# Patient Record
Sex: Female | Born: 1950 | Race: White | Hispanic: No | Marital: Married | Smoking: Former smoker
Health system: Southern US, Community
[De-identification: ages and names within clinical notes are randomized; demographics above are authoritative.]

## PROBLEM LIST (undated history)

## (undated) DIAGNOSIS — M1990 Unspecified osteoarthritis, unspecified site: Secondary | ICD-10-CM

## (undated) DIAGNOSIS — M8580 Other specified disorders of bone density and structure, unspecified site: Secondary | ICD-10-CM

## (undated) DIAGNOSIS — M810 Age-related osteoporosis without current pathological fracture: Secondary | ICD-10-CM

## (undated) DIAGNOSIS — F419 Anxiety disorder, unspecified: Secondary | ICD-10-CM

## (undated) DIAGNOSIS — H269 Unspecified cataract: Secondary | ICD-10-CM

## (undated) HISTORY — PX: WRIST SURGERY: SHX841

## (undated) HISTORY — DX: Unspecified cataract: H26.9

## (undated) HISTORY — DX: Anxiety disorder, unspecified: F41.9

## (undated) HISTORY — DX: Age-related osteoporosis without current pathological fracture: M81.0

## (undated) HISTORY — PX: OTHER SURGICAL HISTORY: SHX169

## (undated) HISTORY — DX: Unspecified osteoarthritis, unspecified site: M19.90

## (undated) HISTORY — PX: EYE SURGERY: SHX253

---

## 2017-02-06 DIAGNOSIS — F419 Anxiety disorder, unspecified: Secondary | ICD-10-CM | POA: Insufficient documentation

## 2017-09-27 HISTORY — PX: ESOPHAGOGASTRODUODENOSCOPY: SHX1529

## 2018-09-01 ENCOUNTER — Other Ambulatory Visit: Payer: Self-pay

## 2018-09-01 ENCOUNTER — Emergency Department: Payer: Medicare Other

## 2018-09-01 ENCOUNTER — Emergency Department
Admission: EM | Admit: 2018-09-01 | Discharge: 2018-09-01 | Disposition: A | Discharge status: Home / Self Care | Payer: Medicare Other | Attending: Emergency Medicine | Admitting: Emergency Medicine

## 2018-09-01 ENCOUNTER — Encounter: Payer: Self-pay | Admitting: Emergency Medicine

## 2018-09-01 DIAGNOSIS — Y9389 Activity, other specified: Secondary | ICD-10-CM | POA: Diagnosis not present

## 2018-09-01 DIAGNOSIS — Z87891 Personal history of nicotine dependence: Secondary | ICD-10-CM | POA: Insufficient documentation

## 2018-09-01 DIAGNOSIS — Y998 Other external cause status: Secondary | ICD-10-CM | POA: Diagnosis not present

## 2018-09-01 DIAGNOSIS — W010XXA Fall on same level from slipping, tripping and stumbling without subsequent striking against object, initial encounter: Secondary | ICD-10-CM | POA: Insufficient documentation

## 2018-09-01 DIAGNOSIS — Y92009 Unspecified place in unspecified non-institutional (private) residence as the place of occurrence of the external cause: Secondary | ICD-10-CM | POA: Insufficient documentation

## 2018-09-01 DIAGNOSIS — S93601A Unspecified sprain of right foot, initial encounter: Secondary | ICD-10-CM | POA: Insufficient documentation

## 2018-09-01 DIAGNOSIS — S99921A Unspecified injury of right foot, initial encounter: Secondary | ICD-10-CM | POA: Diagnosis present

## 2018-09-01 HISTORY — DX: Other specified disorders of bone density and structure, unspecified site: M85.80

## 2018-09-01 NOTE — ED Provider Notes (Signed)
[facility] Emergency Department Provider Note  ____________________________________________  Time seen: Approximately 12:54 PM  I have reviewed the triage vital signs and the nursing notes.   HISTORY  Chief Complaint Ankle Pain    HPI Paige Suarez is a 67 y.o. female presents emergency department for evaluation of right foot pain after fall this morning.  Patient states that she has from [place] visiting her family and tripped over there large yellow dog this morning.  She twisted her right foot.  Pain is over the outside of her foot.  She has had difficulty putting weight on foot since incident.  She denies hitting her head or losing consciousness.  No additional injuries.   Past Medical History:  Diagnosis Date  . Osteopenia     There are no active problems to display for this patient.   History reviewed. No pertinent surgical history.  Prior to Admission medications   Not on File    Allergies Oxycodone  No family history on file.  Social History Social History   Tobacco Use  . Smoking status: Former Smoker    Types: Cigarettes    Last attempt to quit: 1999    Years since quitting: 20.8  . Smokeless tobacco: Never Used  Substance Use Topics  . Alcohol use: Not on file  . Drug use: Not on file     Review of Systems  Gastrointestinal:  No nausea, no vomiting.  Musculoskeletal: Positive for foot pain.  Skin: Negative for  abrasions, lacerations.  Positive for ecchymosis. Neurological: Negative for numbness or tingling   ____________________________________________   PHYSICAL EXAM:  VITAL SIGNS: ED Triage Vitals  Enc Vitals Group     BP 09/01/18 1252 (!) 141/100     Pulse Rate 09/01/18 1252 65     Resp 09/01/18 1252 20     Temp 09/01/18 1252 98.3 F (36.8 C)     Temp Source 09/01/18 1252 Oral     SpO2 09/01/18 1252 98 %     Weight 09/01/18 1056 100 lb (45.4 kg)     Height 09/01/18 1056 5\' 1"  (1.549 m)     Head  Circumference --      Peak Flow --      Pain Score 09/01/18 1055 8     Pain Loc --      Pain Edu? --      Excl. in GC? --      Constitutional: Alert and oriented. Well appearing and in no acute distress. Eyes: Conjunctivae are normal. PERRL. EOMI. Head: Atraumatic. ENT:      Ears:      Nose: No congestion/rhinnorhea.      Mouth/Throat: Mucous membranes are moist.  Neck: No stridor. Cardiovascular: Normal rate, regular rhythm.  Good peripheral circulation. Respiratory: Normal respiratory effort without tachypnea or retractions. Lungs CTAB. Good air entry to the bases with no decreased or absent breath sounds. Musculoskeletal: Full range of motion to all extremities. No gross deformities appreciated.  2 cm area of ecchymosis to lateral foot. No swelling.  No tenderness to palpation over lateral or medial malleolus.  Symmetric dorsalis pedis pulses bilaterally. Neurologic:  Normal speech and language. No gross focal neurologic deficits are appreciated.  Skin:  Skin is warm, dry and intact. No rash noted. Psychiatric: Mood and affect are normal. Speech and behavior are normal. Patient exhibits appropriate insight and judgement.   ____________________________________________   LABS (all labs ordered are listed, but only abnormal results are displayed)  Labs Reviewed -  No data to display ____________________________________________  EKG   ____________________________________________  RADIOLOGY Lexine Baton, personally viewed and evaluated these images (plain radiographs) as part of my medical decision making, as well as reviewing the written report by the radiologist.  Dg Ankle Complete Right  Result Date: 09/01/2018 CLINICAL DATA:  Ankle pain.  Twisting injury. EXAM: RIGHT ANKLE - COMPLETE 3+ VIEW COMPARISON:  Right foot 09/01/2018 FINDINGS: Right ankle is located without a fracture. No soft tissue swelling. Normal alignment. IMPRESSION: Negative. Electronically Signed    By: Richarda Overlie M.D.   On: 09/01/2018 11:54   Dg Foot Complete Right  Result Date: 09/01/2018 CLINICAL DATA:  Right foot and ankle pain following a twisting injury last night. EXAM: RIGHT FOOT COMPLETE - 3+ VIEW COMPARISON:  Right ankle radiographs obtained at the same time. FINDINGS: There is no evidence of fracture or dislocation. There is no evidence of arthropathy or other focal bone abnormality. Soft tissues are unremarkable. IMPRESSION: Normal examination. Electronically Signed   By: Beckie Salts M.D.   On: 09/01/2018 11:56    ____________________________________________    PROCEDURES  Procedure(s) performed:    Procedures    Medications - No data to display   ____________________________________________   INITIAL IMPRESSION / ASSESSMENT AND PLAN / ED COURSE  Pertinent labs & imaging results that were available during my care of the patient were reviewed by me and considered in my medical decision making (see chart for details).  Review of the [place] CSRS was performed in accordance of the [facility] prior to dispensing any controlled drugs.     Patient's diagnosis is consistent with foot sprain.  Ace wrap and postop shoe were given.  Dan Humphreys was given.   Patient is to follow up with primary care as directed. Patient is given ED precautions to return to the ED for any worsening or new symptoms.     ____________________________________________  FINAL CLINICAL IMPRESSION(S) / ED DIAGNOSES  Final diagnoses:  Sprain of right foot, initial encounter      NEW MEDICATIONS STARTED DURING THIS VISIT:  ED Discharge Orders    None          This chart was dictated using voice recognition software/Dragon. Despite best efforts to proofread, errors can occur which can change the meaning. Any change was purely unintentional.    Enid Derry, PA-C 09/01/18 1859    Pershing Proud Myra Rude, MD 09/02/18 [phone]

## 2018-09-01 NOTE — ED Notes (Signed)
See triage note   Presents s/p fall  States she tripped over dog  Pain and swelling noted to right ankle   Good pulses

## 2018-09-01 NOTE — ED Triage Notes (Signed)
Pt states she tripped on a dog this morning and hurt right ankle on both sides. Pt is unable to bear weight on the right foot.

## 2019-08-25 IMAGING — DX DG ANKLE COMPLETE 3+V*R*
3 series · 3 of 3 positions shown · non-contrast
Comparison: Right foot 09/01/2018

CLINICAL DATA: Ankle pain.  Twisting injury.

EXAM:
RIGHT ANKLE - COMPLETE 3+ VIEW

[ankle ap]
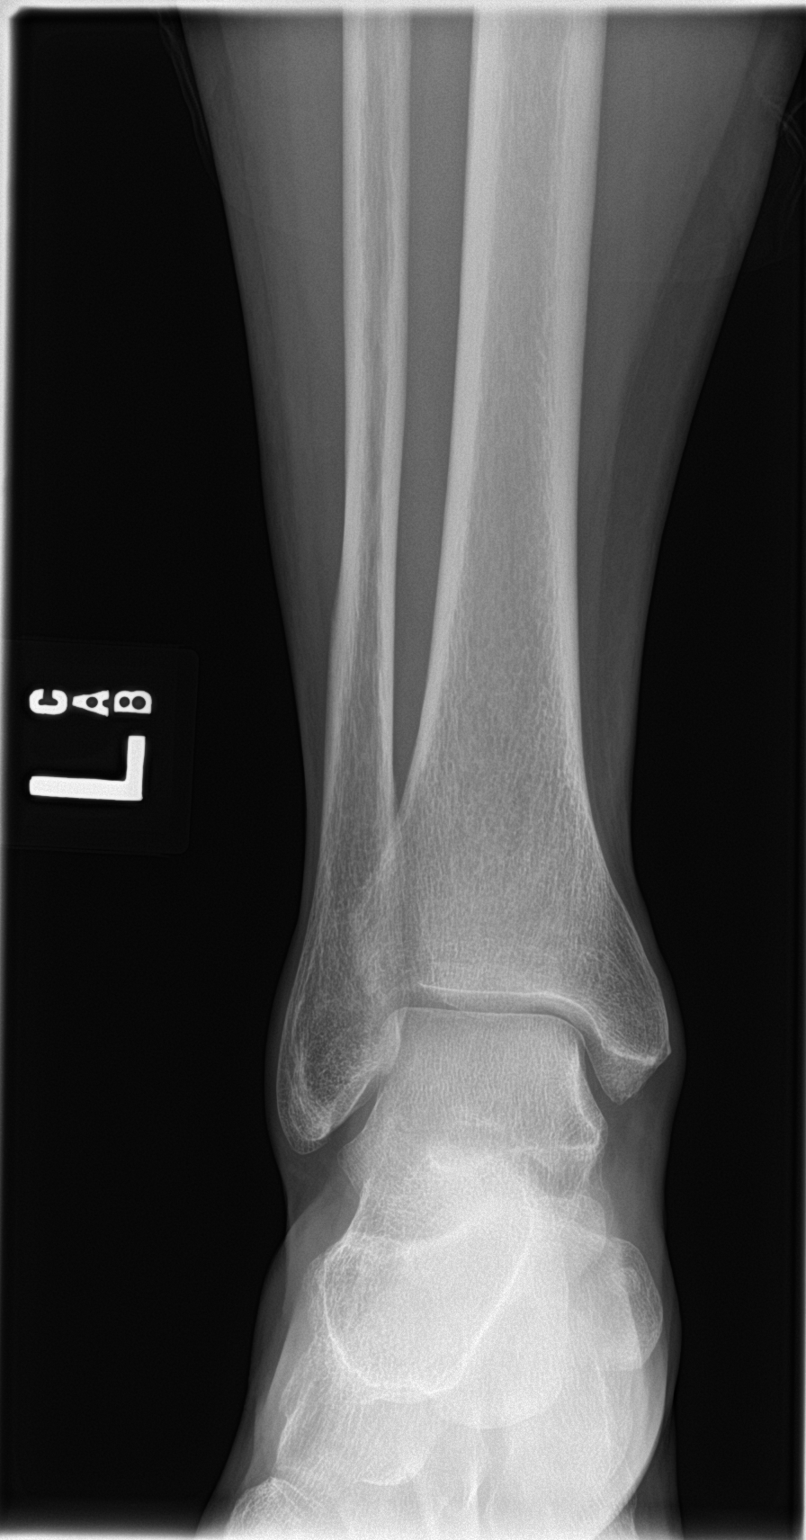

[ankle obl]
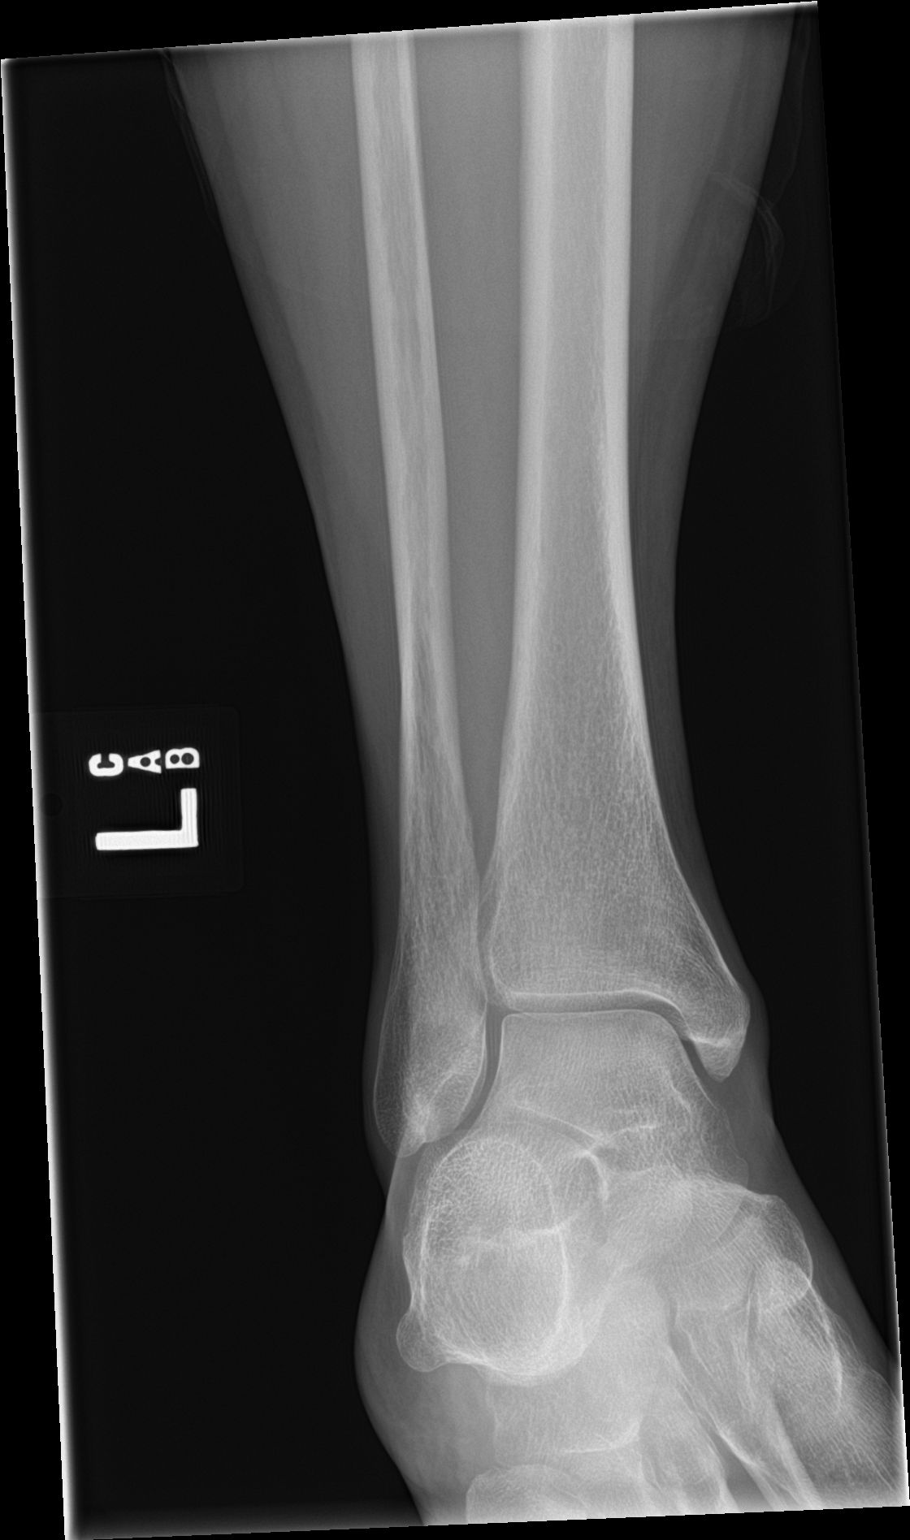

[ankle lat]
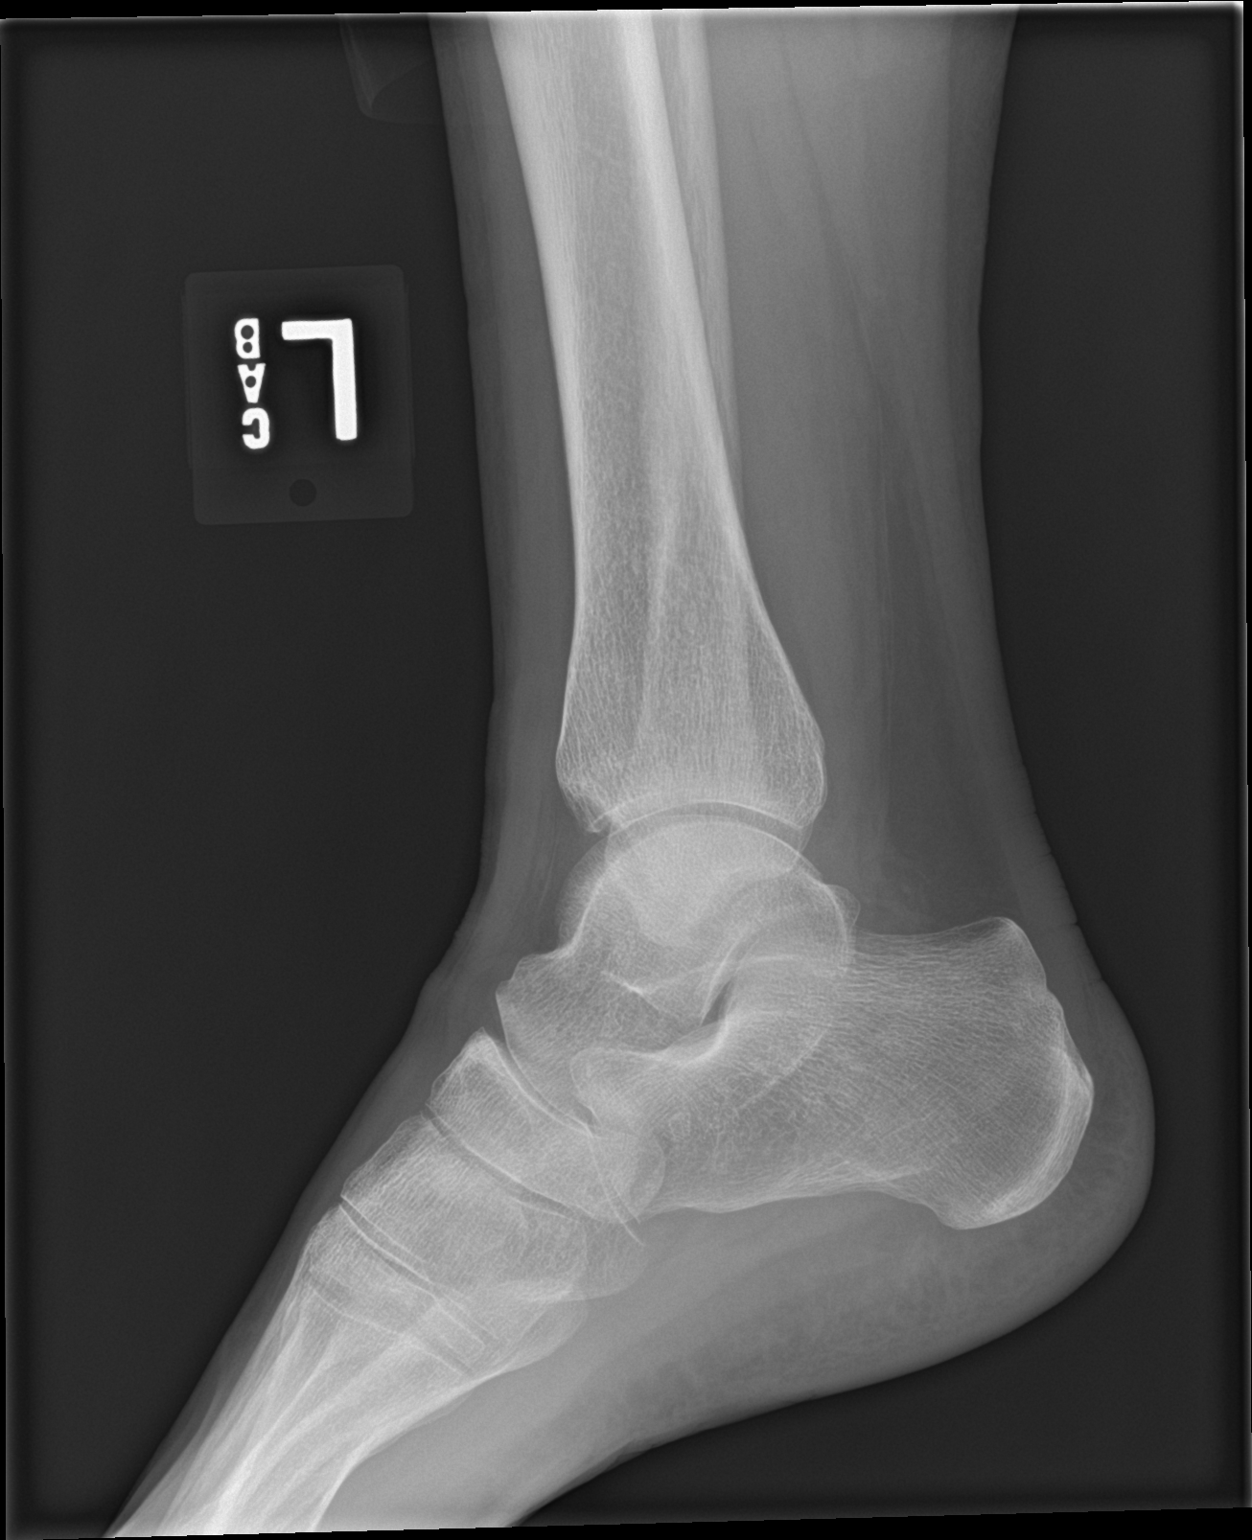

[3 of 3 positions shown; findings below may reference images not displayed]

FINDINGS: Right ankle is located without a fracture. No soft tissue swelling.
Normal alignment.
IMPRESSION: Negative.

## 2019-08-25 IMAGING — DX DG FOOT COMPLETE 3+V*R*
3 series · 3 of 3 positions shown · non-contrast
Comparison: Right ankle radiographs obtained at the same time.

CLINICAL DATA: Right foot and ankle pain following a twisting
injury last night.

EXAM:
RIGHT FOOT COMPLETE - 3+ VIEW

[foot ap]
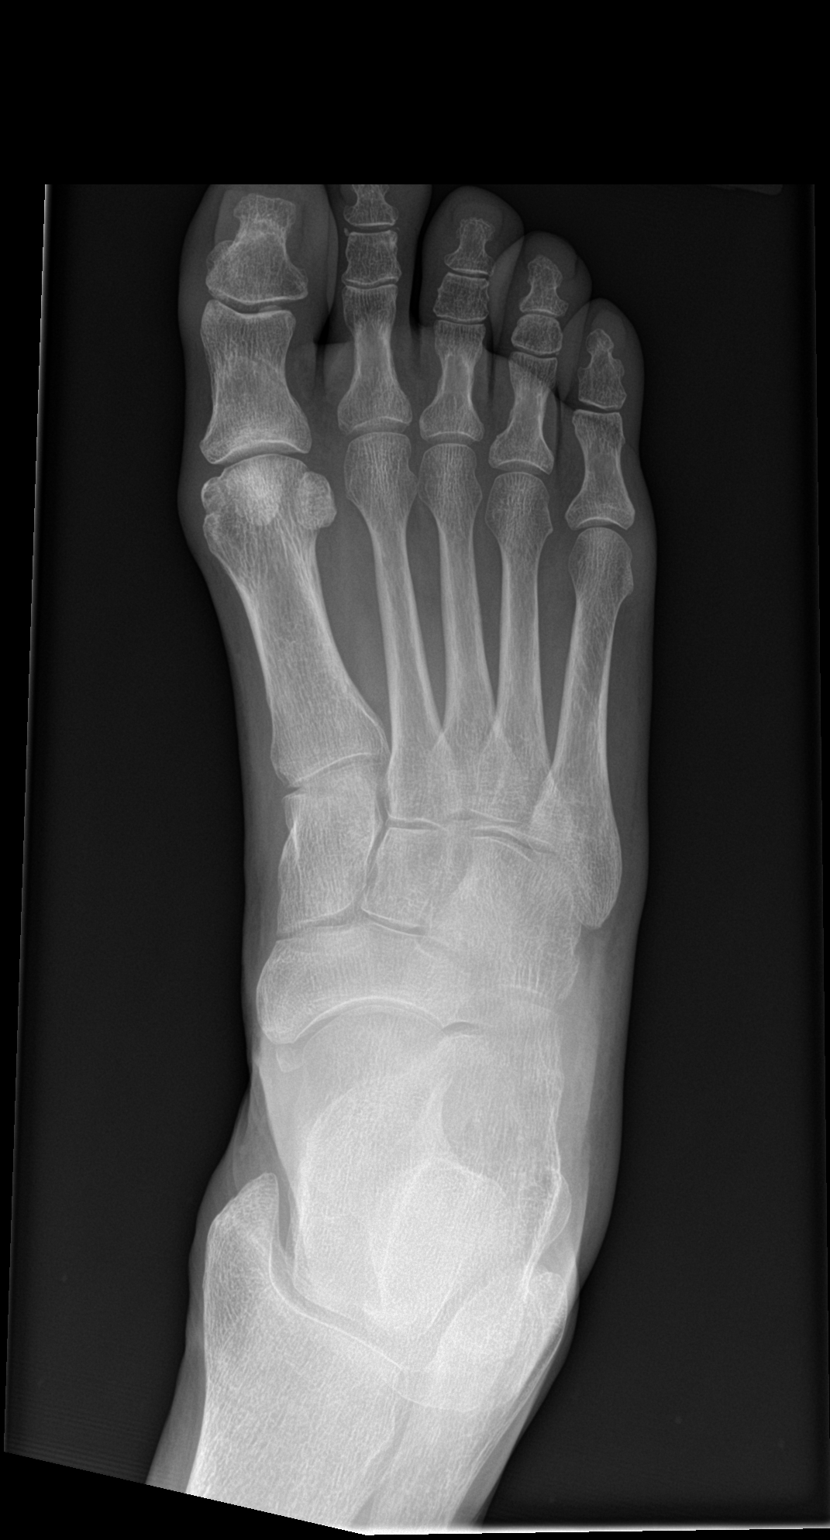

[foot obl]
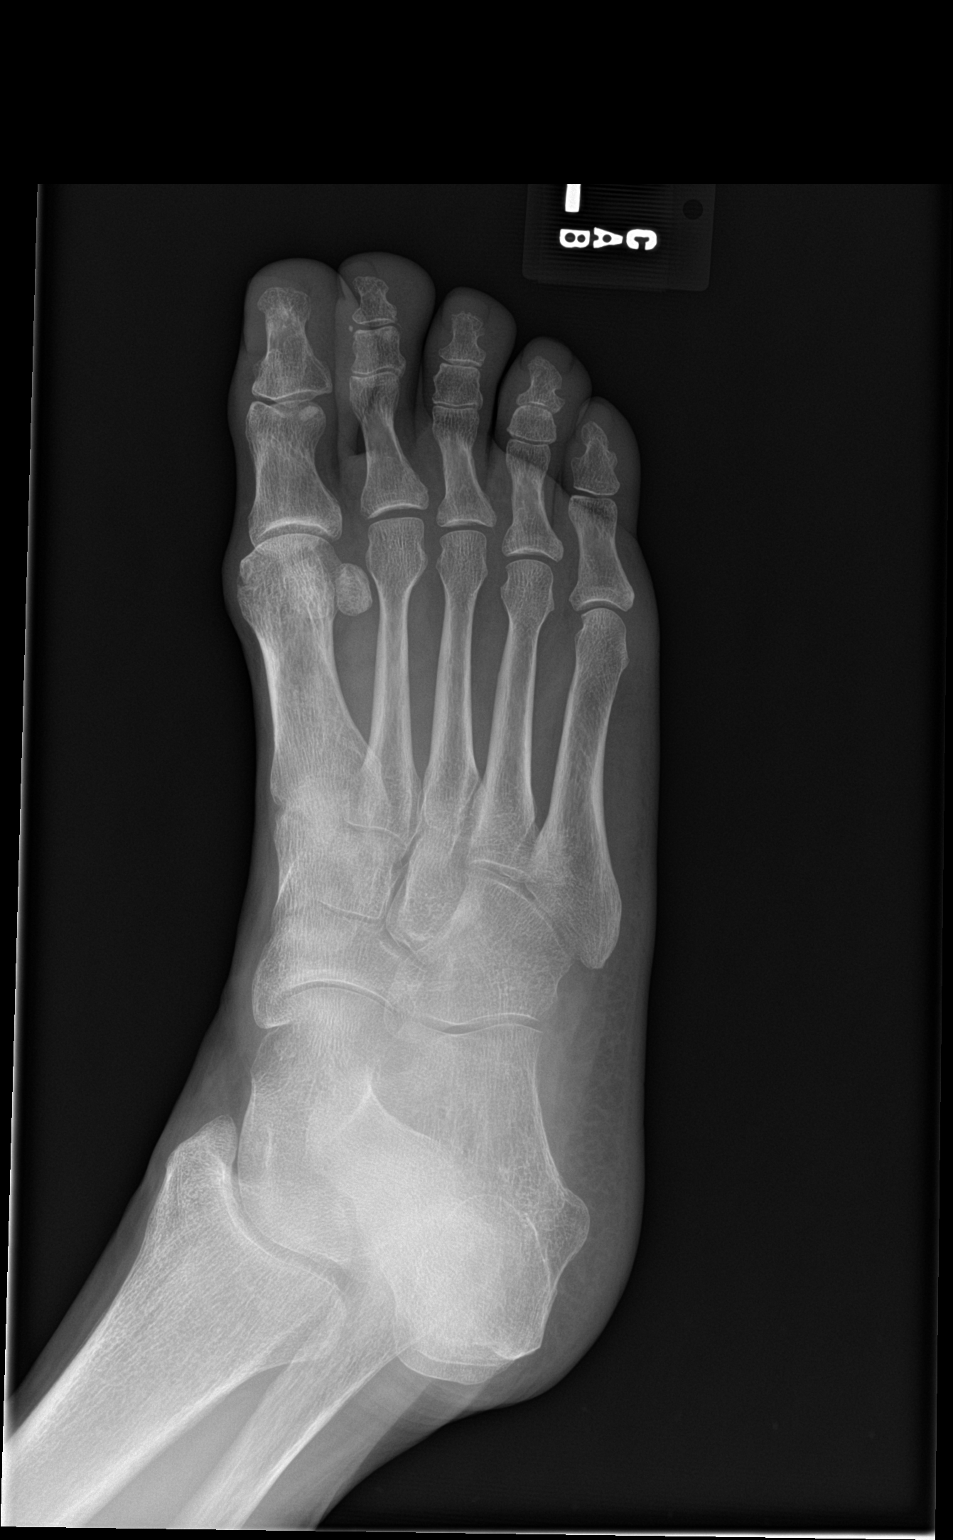

[foot lat]
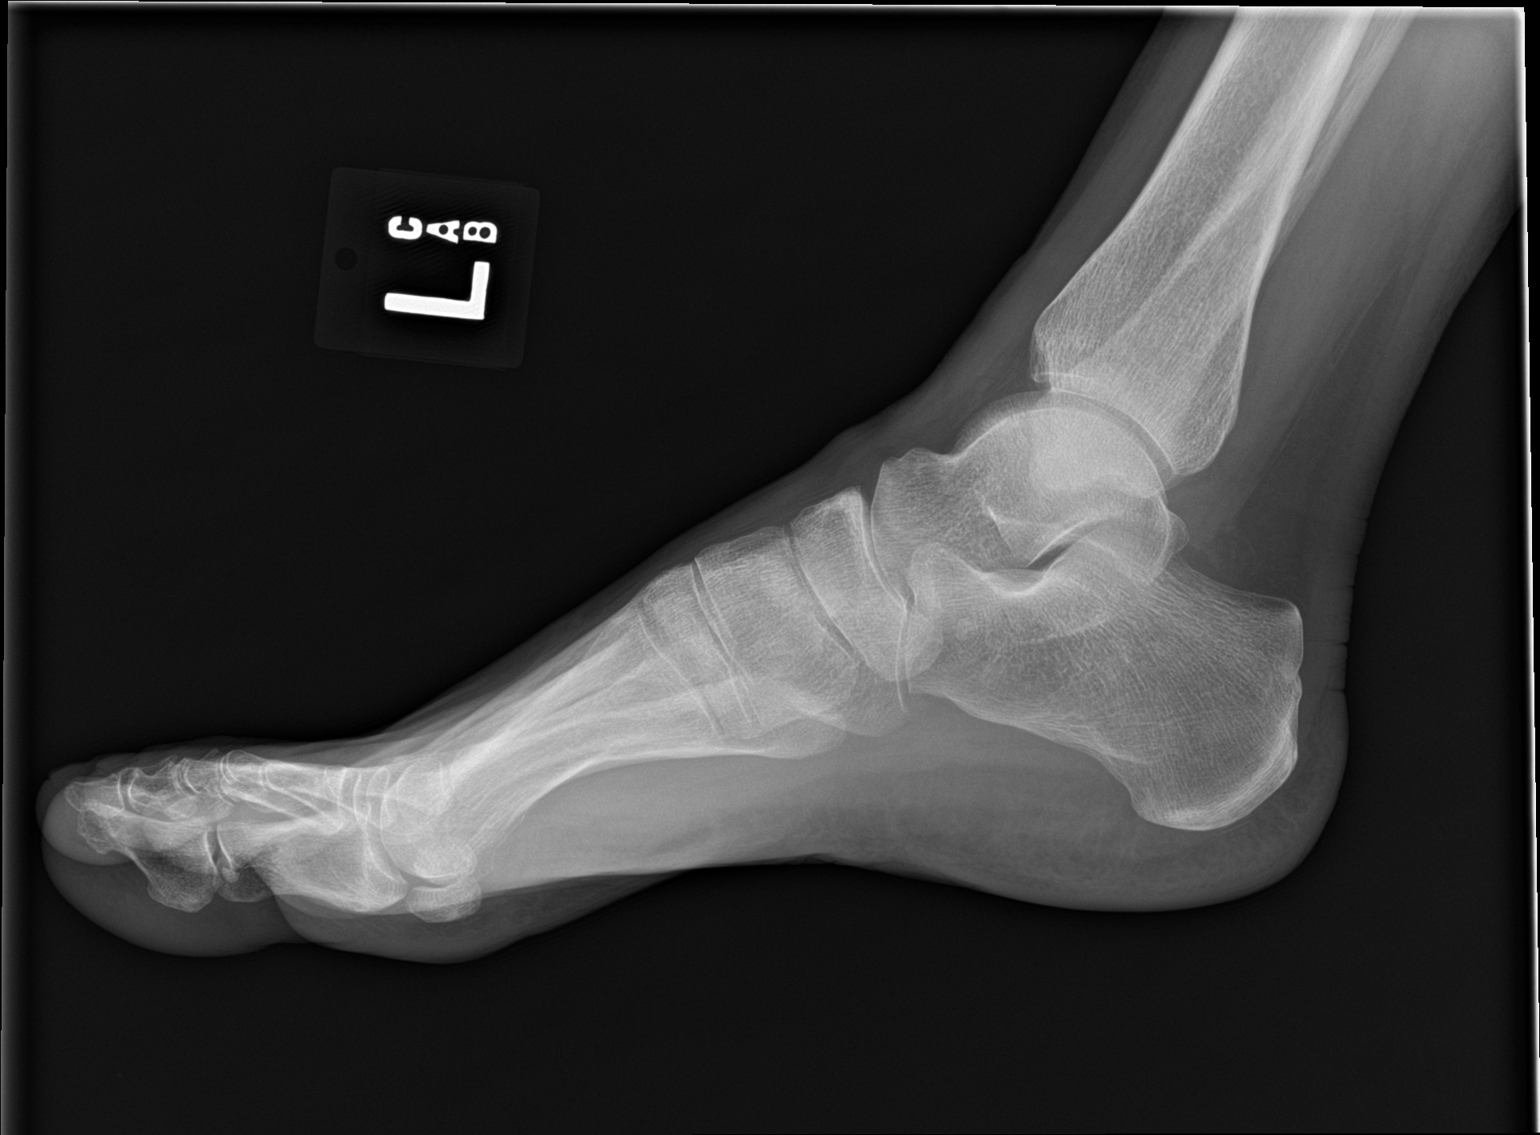

[3 of 3 positions shown; findings below may reference images not displayed]

FINDINGS: There is no evidence of fracture or dislocation. There is no
evidence of arthropathy or other focal bone abnormality. Soft
tissues are unremarkable.
IMPRESSION: Normal examination.

## 2020-02-20 DIAGNOSIS — M810 Age-related osteoporosis without current pathological fracture: Secondary | ICD-10-CM | POA: Insufficient documentation

## 2021-10-16 LAB — HM PAP SMEAR

## 2021-10-16 LAB — RESULTS CONSOLE HPV: CHL HPV: POSITIVE

## 2021-12-12 LAB — HM DEXA SCAN

## 2023-01-02 LAB — HM MAMMOGRAPHY

## 2023-03-22 ENCOUNTER — Telehealth: Payer: Self-pay

## 2023-03-22 NOTE — Telephone Encounter (Signed)
Chiffon called, she is the mother to [employer]. Gloriann and her husband Fayrene Fearing would like to become new patients and asked if you could be their provider? They have bcbs and [employer].

## 2023-03-22 NOTE — Telephone Encounter (Signed)
Yes, I will take them.  Needs an establish care visit, and progress notes/records.  A lot is in MyChart, but not sure if everything is.  Need immunization records and notes before any wellness visit can be scheduled.  Establish care visit first.

## 2023-05-03 ENCOUNTER — Encounter: Payer: Self-pay | Admitting: *Deleted

## 2023-05-09 NOTE — Progress Notes (Unsigned)
No chief complaint on file.  Patient presents to establish care. 72 yo who recently relocated to [place] from [place], to be closer to her daughter  Anxiety--clonazepam, trazodone and medical marijuana  Osteoporosis--h/o talar fracture of R foot.  H/o fosamax treatment for >5 years.  She had telehealth visit with Dr. Raphael Gibney (endocrinologist) in 03/2023. Alendronate was refilled. Vitamin D level was normal at 52. Last DEXA was 12/12/2021                          -Bone Density-                            -----------------------------------------------------------------  Skeletal Site           BMD(g/cm2) T-score Z-score Classification  PA Spine (L1-L4)        0.808      -2.2    0.1     Osteopenia      Left Hip (Total)        0.621      -2.6    -1.0    Osteoporosis    Left Hip (Femoral Neck) 0.591      -2.3    -0.4    Osteopenia                                                                        Follow-up DXA is due in March 2025    IBS EGD--esophageal thickening in 09/2017, on PPI  ??colonoscopy?  Complex pain regional syndrome L foot (pt had thought related to skin infections in foot?)--voltaren gel 04/2019  History of abnormal pap.  Under care of Dr. Elon Spanner. 12/2021 ASCUS HPV + --> COLPO CIN 1 ECC PAP 12/2022 NIL HVP POSITIVE (16/18/45 neg)  Last Pap 01/01/23: Interpretation NEGATIVE FOR INTRAEPITHELIAL LESION OR MALIGNANCY  Electronically signed by Milas Kocher on 01/05/2023 at  9:46 AM  Specimen Adequacy Satisfactory for evaluation. Transformation zone component present.  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 ALSO uses HTZ 25 mg with 2% clotrimazole supp twice weekly  H/o shingles 3x, last time was in 2016.    Labs 02/16/23 c-met, CBC, TSH A1c 5.7 Tchol 240, TG 61, HDL 96, LDL 132, ratio 2.5  Last mammo 12/2022 Saw Derm 11/2022, no abnormal  findings/lesions   ROS:   PHYSICAL EXAM:  There were no vitals taken for this visit.    ASSESSMENT/PLAN:  Need vaccines-- Shingrix? Pneumonia vaccines? Flu, RSV, Tdap?  Colon cancer screening?

## 2023-05-10 ENCOUNTER — Encounter: Payer: Self-pay | Admitting: Family Medicine

## 2023-05-10 ENCOUNTER — Ambulatory Visit (INDEPENDENT_AMBULATORY_CARE_PROVIDER_SITE_OTHER): Payer: Medicare Other | Admitting: Family Medicine

## 2023-05-10 VITALS — BP 130/70 | HR 64 | Ht 61.0 in | Wt 101.6 lb

## 2023-05-10 DIAGNOSIS — F411 Generalized anxiety disorder: Secondary | ICD-10-CM | POA: Diagnosis not present

## 2023-05-10 DIAGNOSIS — Z7185 Encounter for immunization safety counseling: Secondary | ICD-10-CM

## 2023-05-10 DIAGNOSIS — S51812A Laceration without foreign body of left forearm, initial encounter: Secondary | ICD-10-CM | POA: Diagnosis not present

## 2023-05-10 MED ORDER — ESCITALOPRAM OXALATE 5 MG PO TABS: ORAL_TABLET | ORAL | 1 refills | Status: DC

## 2023-05-10 MED ORDER — CLONAZEPAM 0.5 MG PO TABS
0.2500 mg | ORAL_TABLET | Freq: Two times a day (BID) | ORAL | 0 refills | Status: DC | PRN
Start: 2023-05-10 — End: 2023-06-14

## 2023-05-10 NOTE — Patient Instructions (Addendum)
Your skin tear doesn't appear to be infected. Continue using an antibacterial ointment until it has healed. Protect it with a bandage only to prevent further injury (otherwise can be left open to air). If you notice increasing redness, drainage/crusting, swelling or pain, or any fever, these are signs that it is infected   Try and get 1500 mg daily of calcium, from all sources (trying to get as much from your diet as possible, making up the difference with vitamins).  Look into Silver Sneakers classes (you can check to see if [employer] has information regarding these classes/coverage).  These are group exercise classes, and should provide some weight-bearing exercise.  Please consider the newer shingles vaccine (Shingrix), which is 90% effective in preventing shingles infections. If you're interested, you get this from the pharmacy.  I'm starting you on a low dose of lexapro. Normal dose is 10mg . I'm writing for 5 mg and I want you to start at HALF tablet for a week. If after a week you are tolerating it without side effects, increase to the full pill. If you are having side effects ,continue at the 1/2 tablet for longer. It can take up to a month to see the ful effect, and you may need to have the dose increased, to truly notice a significant difference. Let's do this very slowly.  Some people it causes tiredness, and you can take it at night. Some people have insomnia, and they prefer to take it in the morning. I suggest starting it in the morning, and switching if needed, based on side effects.  I don't know how much the clonazepam is truly helping. I suggest switching to a half tablet in the morning, ONLY if needed for anxiety. You may need this for the first few days or week or so of the new medication. Once you anxiety improves, I'd like for you to stop taking it. If it is too sedating, you can switch it back to evening, but ideally it is to help with any daytime anxiety.  You may  not need it for sleep, once your anxiety is under better control with the lexapro.

## 2023-05-17 NOTE — Progress Notes (Signed)
When I called [id number][facility] immunizations the pharmacist pulled up her [place] registry and she did not have anything listed. There was a pneum vaccine listed that was declined, otherwise what we have is all.

## 2023-05-18 ENCOUNTER — Other Ambulatory Visit: Payer: Self-pay

## 2023-05-18 ENCOUNTER — Telehealth: Payer: Self-pay | Admitting: Family Medicine

## 2023-05-18 NOTE — Telephone Encounter (Signed)
Pt called back about lexapro, states she does not like the way it makes her feel  ,and she could not sleep she is not going to take it She went back to Clonazepam .5mg  Trazodone 50 ( she originally told you 100 but it is only 50)

## 2023-05-18 NOTE — Telephone Encounter (Signed)
It is fine for her to continue to take trazodone 50 mg daily (please update her med list). If this isn't effective in helping her sleep, she can take up to 100 mg.  (I'd rather her increase the trazodone dose, if needed for sleep, then also take daily clonazepam).  The lexapro can take 4-6 weeks to see the full effect of how it works, so I would love for her to stick with it.  If it worsens her sleep, she should take it in the morning, rather than at night.  I'd really prefer for her not to use the clonazepam on a daily basis, as we discussed.  Please advise

## 2023-06-07 ENCOUNTER — Ambulatory Visit: Payer: Medicare Other | Admitting: Family Medicine

## 2023-06-14 ENCOUNTER — Telehealth: Payer: Self-pay

## 2023-06-14 DIAGNOSIS — F411 Generalized anxiety disorder: Secondary | ICD-10-CM

## 2023-06-14 MED ORDER — CLONAZEPAM 0.5 MG PO TABS
0.2500 mg | ORAL_TABLET | Freq: Two times a day (BID) | ORAL | 0 refills | Status: AC | PRN
Start: 2023-06-14 — End: ?

## 2023-06-14 NOTE — Telephone Encounter (Signed)
Pt left a voicemail requesting a refill of clonazepam

## 2023-06-26 ENCOUNTER — Ambulatory Visit (INDEPENDENT_AMBULATORY_CARE_PROVIDER_SITE_OTHER): Payer: Medicare Other | Admitting: Sports Medicine

## 2023-06-26 VITALS — BP 130/70 | HR 56 | Ht 61.0 in | Wt 102.0 lb

## 2023-06-26 DIAGNOSIS — M25551 Pain in right hip: Secondary | ICD-10-CM | POA: Diagnosis not present

## 2023-06-26 DIAGNOSIS — M7061 Trochanteric bursitis, right hip: Secondary | ICD-10-CM

## 2023-06-26 NOTE — Progress Notes (Signed)
Paige Suarez D.[facility] [address] [place] [address] Phone: [phone]   Assessment and Plan:     1. Right hip pain 2. Greater trochanteric bursitis of right hip  -Acute, uncomplicated, initial sports medicine visit - Most consistent with greater trochanteric bursitis of right hip for the past 1 month - No red flag symptoms, so no imaging at today's visit - Patient elected for greater trochanteric CSI.  Tolerated well per note below - May use Tylenol as needed for day-to-day pain relief - Start HEP for hip and gluteal musculature  15 additional minutes spent for educating Therapeutic Home Exercise Program.  This included exercises focusing on stretching, strengthening, with focus on eccentric aspects.   Long term goals include an improvement in range of motion, strength, endurance as well as avoiding reinjury. Patient's frequency would include in 1-2 times a day, 3-5 times a week for a duration of 6-12 weeks. Proper technique shown and discussed handout in great detail with ATC.  All questions were discussed and answered.    Procedure: Greater trochanteric bursal injection Side: Right  Risks explained and consent was given verbally. The site was cleaned with alcohol prep. A steroid injection was performed with patient in the lateral side-lying position at area of maximum tenderness over greater trochanter using 2mL of 1% lidocaine without epinephrine and 1mL of kenalog 40mg /ml. This was well tolerated and resulted in symptomatic relief.  Needle was removed, hemostasis achieved, and post injection instructions were explained.  Pt was advised to call or return to clinic if these symptoms worsen or fail to improve as anticipated.    Pertinent previous records reviewed include none   Follow Up: 3 to 4 weeks for reevaluation.  If no improvement or worsening of symptoms, would obtain right hip x-ray with pelvis and could consider intra-articular  pathology versus physical therapy versus NSAID course   Subjective:   I, Paige Suarez, am serving as a Neurosurgeon for Doctor Richardean Sale  Chief Complaint: right side pain   HPI:   06/26/23 Patient is a 72 year old female complaining of right side pain. Patient states that she reached over the side  of a chair a month ago. She had some relief but the pain came back even worse. Lateral hip pain/ bruise. The pain catches her breath. Decreased ROM with movement. No meds for the pain.   Relevant Historical Information: Osteoporosis  Additional pertinent review of systems negative.   Current Outpatient Medications:    alendronate (FOSAMAX) 70 MG tablet, Take 70 mg by mouth once a week. Take with a full glass of water on an empty stomach., Disp: , Rfl:    Calcium-Magnesium-Vitamin D (CALCIUM MAGNESIUM PO), Take 750 mg by mouth daily., Disp: , Rfl:    clonazePAM (KLONOPIN) 0.5 MG tablet, Take 0.5-1 tablets (0.25-0.5 mg total) by mouth 2 (two) times daily as needed for anxiety. Use caution with driving if taking during the dayTake 0.25-0.5 mg by mouth 2 (two) times daily as needed for anxiety. Use caution with driving if taking during the day, Disp: 30 tablet, Rfl: 0   COLOSTRUM PO, Take 1 capsule by mouth daily., Disp: , Rfl:    cyanocobalamin (VITAMIN B12) 500 MCG tablet, Take 500 mcg by mouth daily., Disp: , Rfl:    estradiol (ESTRACE) 0.1 MG/GM vaginal cream, Place 1 Applicatorful vaginally 4 (four) times a week., Disp: , Rfl:    Estradiol 10 MCG TABS vaginal tablet, Place 1 tablet vaginally  2 (two) times a week., Disp: , Rfl:    NONFORMULARY OR COMPOUNDED ITEM, Take 2 drops by mouth daily., Disp: , Rfl:    Probiotic Product (PRO-BIOTIC BLEND PO), Take 1 capsule by mouth daily., Disp: , Rfl:    traZODone (DESYREL) 50 MG tablet, Take 50 mg by mouth at bedtime., Disp: , Rfl:    VITAMIN A PO, Take 1 capsule by mouth daily., Disp: , Rfl:    VITAMIN D, CHOLECALCIFEROL, PO, Take 800 Int'l  Units/L by mouth., Disp: , Rfl:    Objective:     Vitals:   06/26/23 1336  BP: 130/70  Pulse: (!) 56  SpO2: 96%  Weight: 102 lb (46.3 kg)  Height: 5\' 1"  (1.549 m)      Body mass index is 19.27 kg/m.    Physical Exam:    General: awake, alert, and oriented no acute distress, nontoxic Skin: no suspicious lesions or rashes Neuro:sensation intact distally with no deficits, normal muscle tone, no atrophy, strength 5/5 in all tested lower ext groups Psych: normal mood and affect, speech clear   Right hip: No deformity, swelling or wasting ROM Flexion 90, ext 30, IR 45, ER 45 TTP greater trochanter, IT band, gluteal musculature NTTP over the hip flexors,  , si joint, lumbar spine Negative log roll with FROM Negative FABER Negative FADIR Negative Piriformis test   Gait normal    Electronically signed by:  Paige Suarez D.[facility] 2:04 PM 06/26/23

## 2023-06-26 NOTE — Patient Instructions (Addendum)
Tylenol as needed  GT HEP  3-4 week follow up

## 2023-07-01 NOTE — Progress Notes (Unsigned)
No chief complaint on file.  She was last seen 7/25, when she established care. We discussed her moods. Her PHQ-9 score was 4, and GAD-7 score was 9. She reported anxiety and insomnia, and some trouble with adjusting to her move/relocation to [place]. She had been in a regimen of clonazepam, trazodone and medical marijuana (when in [place]). For the last few years, she has taken clonazepam an hour before taking trazodone. Never took other anxiety medications.   Her daughter had told her she needs lexapro or xanax, recalled that her sister also takes a "happy pill", as do others in the family. She was willing to give this a try. She reported she had a h/o depression age 60, treated with therapy. She never took antidepressants.  At her last visit, she was counseled re: community activities to help with adjustment. We started her on a very low dose lexapro, with plans to titrate slowly (rx'd 5mg  lexapro, to take 1/2 tablet for a week, then increase to the full). She was encouraged to take just 1/2 tablet of the clonazepam for 1-2 weeks, then to just use it prn. We had discussed switching it to morning dosing, to help more with daytime anxiety (and switch back to evening if too sedating). She had declined counseling.   She called the office on 8/2, stating that she doesn't like the way lexapro makes her feel, she could not sleep she is not going to take it She went back to Clonazepam 0.5mg , and trazodone (which was 50mg , she had incorrectly told us it was 100mg ).  Advised to continue to take trazodone 50 mg daily, and if this wasn't effective in helping her sleep, she can take up to 100 mg.  (I'd rather her increase the trazodone dose, if needed for sleep, then also take daily clonazepam).  She then reported that she tried 100 mg of trazodone in the past, caused upset stomach.  She didn't want to change anything, wanted to keep on what she has been doing for years.  She was also advised that the lexapro can  take 4-6 weeks to see the full effect of how it works, and I preferred for her to stick with it, if possible.  If it worsens her sleep, she should take it in the morning, rather than at night. I'd really prefer for her not to use the clonazepam on a daily basis, as we discussed.   #30 of clonazepam 0.5 mg refilled on 7/25 and 06/14/23.   9/10 got cortisone injection for R hip bursitis from sports medicine.   PMH, PSH, SH reviewed    ROS:   PHYSICAL EXAM:  There were no vitals taken for this visit.  Wt Readings from Last 3 Encounters:  06/26/23 102 lb (46.3 kg)  05/10/23 101 lb 9.6 oz (46.1 kg)  09/01/18 100 lb (45.4 kg)       ASSESSMENT/PLAN:   PHQ-9, GAD-7  Flu, COVID  Will also need RSV, Prevnar-20 and TdaP (unless she can find records of these--none were at the pharmacy in [place], per your last check)

## 2023-07-02 ENCOUNTER — Ambulatory Visit (INDEPENDENT_AMBULATORY_CARE_PROVIDER_SITE_OTHER): Payer: Medicare Other | Admitting: Family Medicine

## 2023-07-02 ENCOUNTER — Encounter: Payer: Self-pay | Admitting: Family Medicine

## 2023-07-02 VITALS — BP 122/82 | HR 72 | Ht 61.0 in | Wt 102.4 lb

## 2023-07-02 DIAGNOSIS — F411 Generalized anxiety disorder: Secondary | ICD-10-CM | POA: Diagnosis not present

## 2023-07-02 DIAGNOSIS — Z23 Encounter for immunization: Secondary | ICD-10-CM

## 2023-07-02 DIAGNOSIS — G4700 Insomnia, unspecified: Secondary | ICD-10-CM

## 2023-07-02 MED ORDER — TRAZODONE HCL 50 MG PO TABS
50.0000 mg | ORAL_TABLET | Freq: Every day | ORAL | 1 refills | Status: DC
Start: 2023-07-02 — End: 2024-02-26

## 2023-07-02 NOTE — Patient Instructions (Addendum)
The goal is to try and get you to have a good night's sleep without needing clonazepam daily. Trazodone usually needs more time to work--try taking this at 10:30, rather than right when you go to bed. If you can't fall asleep, then try taking 1/2 of the clonazepam 30-60  minutes later. Be sure to wait 8 hours after taking this before any driving. Ideally I would love for you to use the trazodone daily, and the clonazepam (full, or preferably 1/2 tablet) only if needed, ie if you haven't had a good night sleep in a few nights We also briefly discussed Belsomra, which is a much safer medication to use on a nightly basis for sleep. Feel free to read about this, and let me know if you'd like to try it.  Call your pharmacy when you are down to 3-5 pills of the clonazepam, and we will be sure to refill it.

## 2023-07-03 ENCOUNTER — Telehealth: Payer: Self-pay | Admitting: Sports Medicine

## 2023-07-03 NOTE — Telephone Encounter (Signed)
Patient called stating that when she was here to see Dr Jean Rosenthal, they talked about some past xrays she had done at her chiropractor's office. She said that she emailed them to someone here (she wasn't sure who) and wanted to see if Dr Jean Rosenthal got them and if so, if he has reviewed them? She is still having a lot of pain.

## 2023-07-03 NOTE — Telephone Encounter (Signed)
Scheduled new follow up for patient

## 2023-07-04 ENCOUNTER — Ambulatory Visit (INDEPENDENT_AMBULATORY_CARE_PROVIDER_SITE_OTHER): Payer: Medicare Other

## 2023-07-04 ENCOUNTER — Ambulatory Visit (INDEPENDENT_AMBULATORY_CARE_PROVIDER_SITE_OTHER): Payer: Medicare Other | Admitting: Sports Medicine

## 2023-07-04 ENCOUNTER — Other Ambulatory Visit: Payer: Self-pay

## 2023-07-04 VITALS — BP 120/80 | HR 87 | Ht 61.0 in | Wt 102.0 lb

## 2023-07-04 DIAGNOSIS — M1611 Unilateral primary osteoarthritis, right hip: Secondary | ICD-10-CM | POA: Diagnosis not present

## 2023-07-04 DIAGNOSIS — M25551 Pain in right hip: Secondary | ICD-10-CM

## 2023-07-04 NOTE — Progress Notes (Signed)
Paige [facility] [address] [place] [address] Phone: [phone]   Assessment and Plan:     1. Right hip pain 2. Primary osteoarthritis of right hip  -Chronic with exacerbation, subsequent visit - Most consistent with flare of intra-articular osteoarthritis based on repeat examination today, no improvement with greater trochanteric CSI previous office visit on 06/26/2023, x-ray showing mild degenerative changes with femoral head spurring and acetabular spurring - X-ray obtained in clinic.  My interpretation: No acute fracture or dislocation.  Mild degenerative changes with femoral head spurring and acetabular spurring - Patient may use Tylenol for day-to-day pain relief - May continue HEP as tolerated.  Recommend stopping any HEP that flare symptoms - Patient elected for intra-articular hip CSI.  Tolerated well per note below  Procedure: Ultrasound Guided Hip Acetabulofemoral Joint Injection Side: Right Diagnosis: Flare of osteoarthritis [place] Indication:  - accuracy is paramount for diagnosis - to ensure therapeutic efficacy or procedural success - to reduce procedural risk  After explaining the procedure, viable alternatives, risks, and answering any questions, consent was given verbally. The site was cleaned with chlorhexidine prep. An ultrasound transducer was placed on the anterior thigh/hip.   The acetabular joint, labrum, and femoral shaft were identified.  The neurovascular structures were identified and an approach was found specifically avoiding these structures.  A steroid injection was performed under ultrasound guidance with sterile technique using 2ml of 1% lidocaine without epinephrine and 40 mg of triamcinolone (KENALOG) 40mg /ml. This was well tolerated and resulted in  relief.  Needle was removed and dressing placed and post injection instructions were given including  a discussion of likely return of pain today after the  anesthetic wears off (with the possibility of worsened pain) until the steroid starts to work in 1-3 days.   Pt was advised to call or return to clinic if these symptoms worsen or fail to improve as anticipated.   Pertinent previous records reviewed include none   Follow Up: 2 weeks for reevaluation.  If no improvement or worsening of symptoms, could consider physical therapy versus NSAID course versus advanced imaging   Subjective:   I, Moenique Parris, am serving as a Neurosurgeon for Doctor Richardean Sale   Chief Complaint: right side pain    HPI:    06/26/23 Patient is a 72 year old female complaining of right side pain. Patient states that she reached over the side  of a chair a month ago. She had some relief but the pain came back even worse. Lateral hip pain/ bruise. The pain catches her breath. Decreased ROM with movement. No meds for the pain.   07/04/2023 Patient states that she feels like her pain is being aggravated by doing the HEP. She feels as though her pain is higher than where she was given the shot    Relevant Historical Information: Osteoporosis    Additional pertinent review of systems negative.   Current Outpatient Medications:    alendronate (FOSAMAX) 70 MG tablet, Take 70 mg by mouth once a week. Take with a full glass of water on an empty stomach., Disp: , Rfl:    Calcium-Magnesium-Vitamin D (CALCIUM MAGNESIUM PO), Take 750 mg by mouth daily., Disp: , Rfl:    clonazePAM (KLONOPIN) 0.5 MG tablet, Take 0.5-1 tablets (0.25-0.5 mg total) by mouth 2 (two) times daily as needed for anxiety. Use caution with driving if taking during the dayTake 0.25-0.5 mg by mouth 2 (two) times daily as needed for  anxiety. Use caution with driving if taking during the day, Disp: 30 tablet, Rfl: 0   COLOSTRUM PO, Take 1 capsule by mouth daily., Disp: , Rfl:    cyanocobalamin (VITAMIN B12) 500 MCG tablet, Take 500 mcg by mouth daily., Disp: , Rfl:    estradiol (ESTRACE) 0.1 MG/GM vaginal  cream, Place 1 Applicatorful vaginally 4 (four) times a week., Disp: , Rfl:    Estradiol 10 MCG TABS vaginal tablet, Place 1 tablet vaginally 2 (two) times a week., Disp: , Rfl:    Probiotic Product (PRO-BIOTIC BLEND PO), Take 1 capsule by mouth daily., Disp: , Rfl:    traZODone (DESYREL) 50 MG tablet, Take 1 tablet (50 mg total) by mouth at bedtime., Disp: 90 tablet, Rfl: 1   VITAMIN A PO, Take 1 capsule by mouth daily., Disp: , Rfl:    VITAMIN D, CHOLECALCIFEROL, PO, Take 800 Int'l Units/L by mouth., Disp: , Rfl:    Objective:     Vitals:   07/04/23 1044  BP: 120/80  Pulse: 87  SpO2: 99%  Weight: 102 lb (46.3 kg)  Height: 5\' 1"  (1.549 m)      Body mass index is 19.27 kg/m.    Physical Exam:    General: awake, alert, and oriented no acute distress, nontoxic Skin: no suspicious lesions or rashes Neuro:sensation intact distally with no deficits, normal muscle tone, no atrophy, strength 5/5 in all tested lower ext groups Psych: normal mood and affect, speech clear   Right hip: No deformity, swelling or wasting ROM Flexion 90, ext 30, IR 45, ER 45 TTP greater trochanter, IT band, gluteal musculature NTTP over the hip flexors,  , si joint, lumbar spine Positive log roll with FROM Positive FABER for lateral and posterior pain Positive FADIR for lateral and posterior pain Negative Piriformis test   Gait antalgic, favoring left leg    Electronically signed by:  Paige [facility]arez [facility] 12:11 PM 07/04/23

## 2023-07-04 NOTE — Patient Instructions (Signed)
2 week follow up  Recommend no dancing tomorrow but can dance Monday if tolerated

## 2023-07-16 ENCOUNTER — Other Ambulatory Visit: Payer: Self-pay | Admitting: Family Medicine

## 2023-07-16 DIAGNOSIS — F411 Generalized anxiety disorder: Secondary | ICD-10-CM

## 2023-07-16 NOTE — Telephone Encounter (Signed)
Is this okay to refill>? 

## 2023-07-17 NOTE — Progress Notes (Unsigned)
    Aleen Sells [facility] [address] [place] [address] Phone: [phone]   Assessment and Plan:     There are no diagnoses linked to this encounter.  ***   Pertinent previous records reviewed include ***   Follow Up: ***     Subjective:   I, Benjamine Strout, am serving as a Neurosurgeon for Doctor Richardean Sale   Chief Complaint: right side pain    HPI:    06/26/23 Patient is a 72 year old female complaining of right side pain. Patient states that she reached over the side  of a chair a month ago. She had some relief but the pain came back even worse. Lateral hip pain/ bruise. The pain catches her breath. Decreased ROM with movement. No meds for the pain.    07/04/2023 Patient states that she feels like her pain is being aggravated by doing the HEP. She feels as though her pain is higher than where she was given the shot    07/18/2023 Patient states    Relevant Historical Information: Osteoporosis  Additional pertinent review of systems negative.   Current Outpatient Medications:    alendronate (FOSAMAX) 70 MG tablet, Take 70 mg by mouth once a week. Take with a full glass of water on an empty stomach., Disp: , Rfl:    Calcium-Magnesium-Vitamin D (CALCIUM MAGNESIUM PO), Take 750 mg by mouth daily., Disp: , Rfl:    clonazePAM (KLONOPIN) 0.5 MG tablet, TAKE 1/2 TO 1 TABLET BY MOUTH 2 TIMES DAILY AS NEEDED FOR ANXIETY. USE CAUTION WITH DRIVING IF TAKING DURING THE DAY, Disp: 30 tablet, Rfl: 0   COLOSTRUM PO, Take 1 capsule by mouth daily., Disp: , Rfl:    cyanocobalamin (VITAMIN B12) 500 MCG tablet, Take 500 mcg by mouth daily., Disp: , Rfl:    estradiol (ESTRACE) 0.1 MG/GM vaginal cream, Place 1 Applicatorful vaginally 4 (four) times a week., Disp: , Rfl:    Estradiol 10 MCG TABS vaginal tablet, Place 1 tablet vaginally 2 (two) times a week., Disp: , Rfl:    Probiotic Product (PRO-BIOTIC BLEND PO), Take 1 capsule by mouth daily.,  Disp: , Rfl:    traZODone (DESYREL) 50 MG tablet, Take 1 tablet (50 mg total) by mouth at bedtime., Disp: 90 tablet, Rfl: 1   VITAMIN A PO, Take 1 capsule by mouth daily., Disp: , Rfl:    VITAMIN D, CHOLECALCIFEROL, PO, Take 800 Int'l Units/L by mouth., Disp: , Rfl:    Objective:     There were no vitals filed for this visit.    There is no height or weight on file to calculate BMI.    Physical Exam:    ***   Electronically signed by:  Aleen Sells [facility] 7:38 AM 07/17/23

## 2023-07-18 ENCOUNTER — Ambulatory Visit: Payer: Medicare Other | Admitting: Sports Medicine

## 2023-07-18 ENCOUNTER — Other Ambulatory Visit: Payer: Self-pay

## 2023-07-18 VITALS — BP 120/80 | HR 60 | Ht 61.0 in | Wt 102.0 lb

## 2023-07-18 DIAGNOSIS — M1611 Unilateral primary osteoarthritis, right hip: Secondary | ICD-10-CM

## 2023-07-18 DIAGNOSIS — M25551 Pain in right hip: Secondary | ICD-10-CM | POA: Diagnosis not present

## 2023-07-18 DIAGNOSIS — M810 Age-related osteoporosis without current pathological fracture: Secondary | ICD-10-CM

## 2023-07-19 ENCOUNTER — Other Ambulatory Visit (INDEPENDENT_AMBULATORY_CARE_PROVIDER_SITE_OTHER): Payer: Medicare Other

## 2023-07-19 DIAGNOSIS — M810 Age-related osteoporosis without current pathological fracture: Secondary | ICD-10-CM | POA: Diagnosis not present

## 2023-07-19 LAB — RENAL FUNCTION PANEL
Albumin: 4.5 g/dL (ref 3.5–5.2)
BUN: 14 mg/dL (ref 6–23)
CO2: 28 meq/L (ref 19–32)
Calcium: 9.8 mg/dL (ref 8.4–10.5)
Chloride: 102 meq/L (ref 96–112)
Creatinine, Ser: 0.72 mg/dL (ref 0.40–1.20)
GFR: 82.39 mL/min (ref >60.00)
Glucose, Bld: 88 mg/dL (ref 70–99)
Phosphorus: 3.2 mg/dL (ref 2.3–4.6)
Potassium: 4.6 meq/L (ref 3.5–5.1)
Sodium: 137 meq/L (ref 135–145)

## 2023-07-19 LAB — VITAMIN D 25 HYDROXY (VIT D DEFICIENCY, FRACTURES): VITD: 43.03 ng/mL (ref 30.00–100.00)

## 2023-07-19 LAB — MAGNESIUM: Magnesium: 2.4 mg/dL (ref 1.5–2.5)

## 2023-07-23 LAB — PTH, INTACT AND CALCIUM
Calcium: 9.9 mg/dL (ref 8.6–10.4)
PTH: 29 pg/mL (ref 16–77)

## 2023-07-23 LAB — VITAMIN D 1,25 DIHYDROXY
Vitamin D 1, 25 (OH)2 Total: 33 pg/mL (ref 18–72)
Vitamin D2 1, 25 (OH)2: <8 pg/mL
Vitamin D3 1, 25 (OH)2: 33 pg/mL

## 2023-07-26 ENCOUNTER — Ambulatory Visit: Payer: BLUE CROSS/BLUE SHIELD | Admitting: Sports Medicine

## 2023-07-30 ENCOUNTER — Encounter: Payer: Self-pay | Admitting: "Endocrinology

## 2023-07-30 ENCOUNTER — Ambulatory Visit (INDEPENDENT_AMBULATORY_CARE_PROVIDER_SITE_OTHER): Payer: Medicare Other | Admitting: "Endocrinology

## 2023-07-30 ENCOUNTER — Other Ambulatory Visit: Payer: Self-pay

## 2023-07-30 VITALS — BP 140/73 | HR 52 | Ht 61.0 in | Wt 103.0 lb

## 2023-07-30 DIAGNOSIS — M810 Age-related osteoporosis without current pathological fracture: Secondary | ICD-10-CM

## 2023-07-30 MED ORDER — ALENDRONATE SODIUM 70 MG PO TABS: 70.0000 mg | ORAL_TABLET | ORAL | 0 refills | Status: DC

## 2023-07-30 NOTE — Progress Notes (Signed)
[facility] Note [name], MD    Referring Provider: No ref. provider found Primary Care Provider: Joselyn Arrow, MD No chief complaint on file.    Assessment & Plan  Diagnoses and all orders for this visit:  Age-related osteoporosis without current pathological fracture     Osteoporosis Likely secondary cause from age.  BMD results suggest: -2.6 at L hip in 12/12/2021 . Due next 12/2023. Recommend to use calcium 600 mg twice daily and vitamin D 2000 units OTC supplements (agree with current cocktail of calcium and Vit D).  Discussed weight bearing exercise options and dietary supplements. Will evaluate for secondary causes of osteoporosis.  Educated on risks and side effects of fosamax including but not limited to esophagitis, worsening GERD, atypical femoral fractures and osteonecrosis of the jaw. Advised to take medication first thing in the morning with plenty of water and stay upright for 30 minutes after taking the medication.  Advised fall precautions, adequate dairy in diet and exercises (aerobic, balancing and weight bearing) as tolerated.   Return in about 4 months (around 11/30/2023).  I have reviewed current medications, nurse's notes, allergies, vital signs, past medical and surgical history, family medical history, and social history for this encounter. Counseled patient on symptoms, examination findings, lab findings, imaging results, treatment decisions and monitoring and prognosis. The patient understood the recommendations and agrees with the treatment plan. All questions regarding treatment plan were fully answered.   Altamese Baird, MD   07/30/23    History of Present Illness Paige Suarez is a 72 y.o. year old female who presents to our clinic with osteoporosis.  She has been on fosamax from 2002-2011 and again from Sep 2021-current. No S/E.  Currently taking combination of calcium citrate 1000 mg bid with Vit D 400 units bid and magnesium  500 mg bid, along with Vit D 400IU bid separately.    Risk Factors screening:  History of low trauma fractures: Yes, twice R wrist fracture Family history of osteoporosis: No Hip fracture in first-degree relatives: No Smoking history: No, quit in 1999 Excessive alcohol intake >2 drinks/day: No Excessive caffeine intake >2 drinks/day: No Glucocorticoid use >5mg  prednisone/day for >3 months: No Rheumatoid arthritis history: No Premature/Surgical Menopause: No  Component     Latest Ref Rng 07/19/2023  Sodium     135 - 145 mEq/L 137   Potassium     3.5 - 5.1 mEq/L 4.6   Chloride     96 - 112 mEq/L 102   CO2     19 - 32 mEq/L 28   Albumin     3.5 - 5.2 g/dL 4.5   BUN     6 - 23 mg/dL 14   Creatinine     4.09 - 1.20 mg/dL 8.11   Glucose     70 - 99 mg/dL 88   Phosphorus     2.3 - 4.6 mg/dL 3.2   GFR     >91.47 mL/min 82.39   Calcium     8.6 - 10.4 mg/dL 9.9   Calcium     8.4 - 10.5 mg/dL 9.8   Vitamin D 1, 25 (OH) Total     18 - 72 pg/mL 33   Vitamin D3 1, 25 (OH)     pg/mL 33   Vitamin D2 1, 25 (OH)     pg/mL <8   PTH, Intact     16 - 77 pg/mL 29   VITD     30.00 -  100.00 ng/mL 43.03   Magnesium     1.5 - 2.5 mg/dL 2.4     Physical Exam  BP (!) 140/73   Pulse (!) 52   Ht 5\' 1"  (1.549 m)   Wt 103 lb (46.7 kg)   SpO2 97%   BMI 19.46 kg/m  Constitutional: well developed, well nourished Head: normocephalic, atraumatic Eyes: sclera anicteric, no redness Neck: supple Lungs: normal respiratory effort Neurology: alert and oriented Skin: dry, no appreciable rashes Musculoskeletal: no appreciable defects Psychiatric: normal mood and affect  Allergies Allergies  Allergen Reactions   Sulfa Antibiotics    Oxycodone Nausea And Vomiting and Rash    Pt states any strong pain medication affects her    Current Medications Patient's Medications  New Prescriptions   No medications on file  Previous Medications   ALENDRONATE (FOSAMAX) 70 MG TABLET    Take  1 tablet (70 mg total) by mouth once a week. Take with a full glass of water on an empty stomach.   CALCIUM-MAGNESIUM-VITAMIN D (CALCIUM MAGNESIUM PO)    Take 750 mg by mouth daily.   CLONAZEPAM (KLONOPIN) 0.5 MG TABLET    TAKE 1/2 TO 1 TABLET BY MOUTH 2 TIMES DAILY AS NEEDED FOR ANXIETY. USE CAUTION WITH DRIVING IF TAKING DURING THE DAY   COLOSTRUM PO    Take 1 capsule by mouth daily.   CYANOCOBALAMIN (VITAMIN B12) 500 MCG TABLET    Take 500 mcg by mouth daily.   ESTRADIOL (ESTRACE) 0.1 MG/GM VAGINAL CREAM    Place 1 Applicatorful vaginally 4 (four) times a week.   ESTRADIOL 10 MCG TABS VAGINAL TABLET    Place 1 tablet vaginally 2 (two) times a week.   PROBIOTIC PRODUCT (PRO-BIOTIC BLEND PO)    Take 1 capsule by mouth daily.   TRAZODONE (DESYREL) 50 MG TABLET    Take 1 tablet (50 mg total) by mouth at bedtime.   VITAMIN A PO    Take 1 capsule by mouth daily.   VITAMIN D, CHOLECALCIFEROL, PO    Take 800 Int'l Units/L by mouth.  Modified Medications   No medications on file  Discontinued Medications   No medications on file     Past Medical History Past Medical History:  Diagnosis Date   Anxiety    Arthritis    Cataract 072024   No surgery yet   Osteopenia    Osteoporosis     Past Surgical History Past Surgical History:  Procedure Laterality Date   ESOPHAGOGASTRODUODENOSCOPY  09/27/2017   small hiatal hernia, reflux, esophagitis, erosive gastropathy   EYE SURGERY     Lasik   ovarectomy     benign   WRIST SURGERY Right    ligament tear, repaired    Family History family history includes Cancer in her father; Heart attack (age of onset: 39) in her sister; Heart disease in her father; Obesity in her daughter; Stroke in her father.  Social History Social History   Socioeconomic History   Marital status: Married    Spouse name: Not on file   Number of children: Not on file   Years of education: Not on file   Highest education level: Not on file  Occupational History    Not on file  Tobacco Use   Smoking status: Former    Current packs/day: 0.00    Types: Cigarettes    Quit date: 1999    Years since quitting: 25.8   Smokeless tobacco: Never  Vaping Use   Vaping  status: Never Used  Substance and Sexual Activity   Alcohol use: Not Currently    Comment: One every 3 months ir so   Drug use: Not Currently    Types: Marijuana   Sexual activity: Yes    Birth control/protection: None  Other Topics Concern   Not on file  Social History Narrative   Married.   Moved from [place], [place]   2 daughters (1 in [place], 1 in [place])   No pets      Enjoys dancing (line dancing)   Social Determinants of Health   Financial Resource Strain: Not on file  Food Insecurity: Not on file  Transportation Needs: Not on file  Physical Activity: Not on file  Stress: Not on file  Social Connections: Not on file  Intimate Partner Violence: Not on file    Laboratory Investigations No components found for: "CMP" No components found for: "BMP" Lab Results  Component Value Date   GFR 82.39 07/19/2023   Lab Results  Component Value Date   CREATININE 0.72 07/19/2023   No results found for: "CBC" No components found for: "LFT" No components found for: "VITD" Lab Results  Component Value Date   PTH 29 07/19/2023   No results found for: "TSH"  No components found for: "RENAL FUNCTION" No components found for: "MAGNESIUM"  Parts of this note may have been dictated using voice recognition software. There may be variances in spelling and vocabulary which are unintentional. Not all errors are proofread. Please notify the Thereasa Parkin if any discrepancies are noted or if the meaning of any statement is not clear.

## 2023-08-08 ENCOUNTER — Other Ambulatory Visit: Payer: Self-pay | Admitting: Family Medicine

## 2023-08-08 DIAGNOSIS — F411 Generalized anxiety disorder: Secondary | ICD-10-CM

## 2023-08-08 NOTE — Telephone Encounter (Signed)
Patient is asking for refill, she is going to [facility] on the 31st so she would like to pick these up by the 29th.

## 2023-09-12 ENCOUNTER — Other Ambulatory Visit: Payer: Self-pay | Admitting: Family Medicine

## 2023-09-12 DIAGNOSIS — F411 Generalized anxiety disorder: Secondary | ICD-10-CM

## 2023-09-15 ENCOUNTER — Encounter: Payer: Self-pay | Admitting: Family Medicine

## 2023-09-15 DIAGNOSIS — F411 Generalized anxiety disorder: Secondary | ICD-10-CM

## 2023-09-15 MED ORDER — CLONAZEPAM 0.5 MG PO TABS
ORAL_TABLET | ORAL | 5 refills | Status: DC
Start: 2023-09-15 — End: 2024-02-12

## 2023-10-07 NOTE — Progress Notes (Unsigned)
No chief complaint on file.  Patient presents to discuss neck pain. ?muscle relaxers?   She had seen Dr. Jean Rosenthal for R hip pain. Injection to trochanteric bursa didn't help, but she did get relief from steroid intra-articular injection in 06/2023.    PMH, PSH, SH reviewed   ROS:    PHYSICAL EXAM:  There were no vitals taken for this visit.      ASSESSMENT/PLAN:    OA R hip, Dr. Jean Rosenthal

## 2023-10-08 ENCOUNTER — Ambulatory Visit (INDEPENDENT_AMBULATORY_CARE_PROVIDER_SITE_OTHER): Payer: Medicare Other | Admitting: Family Medicine

## 2023-10-08 ENCOUNTER — Encounter: Payer: Self-pay | Admitting: Family Medicine

## 2023-10-08 VITALS — BP 130/70 | HR 72 | Ht 61.0 in | Wt 104.8 lb

## 2023-10-08 DIAGNOSIS — M62838 Other muscle spasm: Secondary | ICD-10-CM | POA: Diagnosis not present

## 2023-10-08 DIAGNOSIS — M25551 Pain in right hip: Secondary | ICD-10-CM

## 2023-10-08 DIAGNOSIS — F411 Generalized anxiety disorder: Secondary | ICD-10-CM

## 2023-10-08 MED ORDER — METHOCARBAMOL 500 MG PO TABS
500.0000 mg | ORAL_TABLET | Freq: Three times a day (TID) | ORAL | 0 refills | Status: DC | PRN
Start: 2023-10-08 — End: 2023-10-25

## 2023-10-08 NOTE — Patient Instructions (Addendum)
Continue your home exercises from the chiropractor. Add in the 3 stretches shown here--chin to chest, ear to shoulder, and looking over your shoulder (10 repetitions, twice daily). Best to do these after heat. At some point, add in the strengthening exercises (push on the opposite side from where you were stretching--push to strengthen, instead of the pull with the stretch)  You might benefit from Tens unit (electrical stimulation)--maybe have them check your home machine  You can try topical medications such as Biofreeze, SalonPas patches.  Consider counseling (with a therapist, not a psychiatrist)

## 2023-10-24 ENCOUNTER — Other Ambulatory Visit: Payer: Self-pay | Admitting: Family Medicine

## 2023-10-24 DIAGNOSIS — G4700 Insomnia, unspecified: Secondary | ICD-10-CM

## 2023-10-25 ENCOUNTER — Other Ambulatory Visit: Payer: Self-pay | Admitting: *Deleted

## 2023-10-25 ENCOUNTER — Telehealth: Payer: Self-pay | Admitting: *Deleted

## 2023-10-25 ENCOUNTER — Other Ambulatory Visit: Payer: Self-pay | Admitting: Family Medicine

## 2023-10-25 DIAGNOSIS — M62838 Other muscle spasm: Secondary | ICD-10-CM

## 2023-10-25 MED ORDER — METHOCARBAMOL 500 MG PO TABS: 500.0000 mg | ORAL_TABLET | Freq: Three times a day (TID) | ORAL | 0 refills | Status: DC | PRN

## 2023-10-25 NOTE — Telephone Encounter (Signed)
 You can refill it just this one time. She can f/u sooner with Dr. Katrinka Blazing if not improving

## 2023-10-25 NOTE — Telephone Encounter (Signed)
 Patient did request this medication to be refilled.

## 2023-10-25 NOTE — Telephone Encounter (Signed)
 This is not intended as a long-term medication. She should schedule a follow-up if still having neck spasms.

## 2023-10-25 NOTE — Telephone Encounter (Signed)
 I had called patient earlier and left a message letting her know that methocarbamol  was intended for short term use and if she was still having neck pain to schedule appt. Patient called back and wanted to let you know that she is really in need of this refill. She said she made the 20 last almost 20 days, even thought they were prescribed for 10 days. She is scheduled with Dr. Claudene for 12/07/23 and she is still in a lot of pain, having spasms. She has been doing all the exercises you gave her as well as the chiro. And went for a massage yesterday.

## 2023-10-25 NOTE — Telephone Encounter (Signed)
Refill sent and patient advised.  °

## 2023-11-01 NOTE — Progress Notes (Signed)
[facility] [address] [place] [address] Phone: [phone] Subjective:    I'm seeing this patient by the request  of:  Joselyn Arrow, MD  CC: neck pain follow up   [id number]  Paige Suarez is a 73 y.o. female coming in with complaint of neck pain. Patient states that her pain began in December 2024. Patient was cleaning shower and hit head on the door handle and wonders if injured neck at that time. Otherwise is unsure where pain originated from. Pain in both side of neck that radiates into back of her skull. Denies any distal radiation of symptoms. Pain is causing frontal headaches. Patient is using mm relaxers once daily prn.       Past Medical History:  Diagnosis Date   Anxiety    Arthritis    Cataract 072024   No surgery yet   Osteopenia    Osteoporosis    Past Surgical History:  Procedure Laterality Date   ESOPHAGOGASTRODUODENOSCOPY  09/27/2017   small hiatal hernia, reflux, esophagitis, erosive gastropathy   EYE SURGERY     Lasik   ovarectomy     benign   WRIST SURGERY Right    ligament tear, repaired   Social History   Socioeconomic History   Marital status: Married    Spouse name: Not on file   Number of children: Not on file   Years of education: Not on file   Highest education level: Not on file  Occupational History   Not on file  Tobacco Use   Smoking status: Former    Current packs/day: 0.00    Types: Cigarettes    Quit date: 1999    Years since quitting: 26.0   Smokeless tobacco: Never  Vaping Use   Vaping status: Never Used  Substance and Sexual Activity   Alcohol use: Not Currently    Comment: One every 3 months ir so   Drug use: Not Currently    Types: Marijuana   Sexual activity: Yes    Birth control/protection: None  Other Topics Concern   Not on file  Social History Narrative   Married.   Moved from [place], [place]   2 daughters (1 in [place], 1 in [place])   No pets      Enjoys dancing  (line dancing)   Social Drivers of Corporate investment banker Strain: Not on file  Food Insecurity: Not on file  Transportation Needs: Not on file  Physical Activity: Not on file  Stress: Not on file  Social Connections: Not on file   Allergies  Allergen Reactions   Sulfa Antibiotics    Oxycodone Nausea And Vomiting and Rash    Pt states any strong pain medication affects her   Family History  Problem Relation Age of Onset   Cancer Father        brain   Heart disease Father    Stroke Father    Heart attack Sister 37   Obesity Daughter     Current Outpatient Medications (Endocrine & Metabolic):    alendronate (FOSAMAX) 70 MG tablet, Take 1 tablet (70 mg total) by mouth once a week. Take with a full glass of water on an empty stomach.     Current Outpatient Medications (Hematological):    cyanocobalamin (VITAMIN B12) 500 MCG tablet, Take 500 mcg by mouth daily.  Current Outpatient Medications (Other):    Calcium-Magnesium-Vitamin D (CALCIUM MAGNESIUM PO), Take 750 mg by mouth daily.   clonazePAM (  KLONOPIN) 0.5 MG tablet, TAKE 1/2 TO 1 TABLET BY MOUTH 2 TIMES DAILY AS NEEDED FOR ANXIETY. USE CAUTION WITH DRIVING IF TAKING DURING THE DAY   COLOSTRUM PO, Take 1 capsule by mouth daily.   estradiol (ESTRACE) 0.1 MG/GM vaginal cream, Place 1 Applicatorful vaginally 4 (four) times a week.   Estradiol 10 MCG TABS vaginal tablet, Place 1 tablet vaginally 2 (two) times a week.   methocarbamol (ROBAXIN) 500 MG tablet, Take 1-2 tablets (500-1,000 mg total) by mouth every 8 (eight) hours as needed for muscle spasms.   Probiotic Product (PRO-BIOTIC BLEND PO), Take 1 capsule by mouth daily.   traZODone (DESYREL) 50 MG tablet, Take 1 tablet (50 mg total) by mouth at bedtime.   TURMERIC PO, Take 2,250 mg by mouth daily.   VITAMIN A PO, Take 1 capsule by mouth daily.   VITAMIN D, CHOLECALCIFEROL, PO, Take 800 Int'l Units/L by mouth.   Reviewed prior external information including notes  and imaging from  primary care provider As well as notes that were available from care everywhere and other healthcare systems.  Past medical history, social, surgical and family history all reviewed in electronic medical record.  No pertanent information unless stated regarding to the chief complaint.   Review of Systems:  No headache, visual changes, nausea, vomiting, diarrhea, constipation, dizziness, abdominal pain, skin rash, fevers, chills, night sweats, weight loss, swollen lymph nodes, body aches, joint swelling, chest pain, shortness of breath, mood changes. POSITIVE muscle aches  Objective  Blood pressure 96/69, pulse 81, height 5\' 1"  (1.549 m), weight 104 lb (47.2 kg), SpO2 98%.   General: No apparent distress alert and oriented x3 mood and affect normal, dressed appropriately.  HEENT: Pupils equal, extraocular movements intact  Respiratory: Patient's speak in full sentences and does not appear short of breath  Cardiovascular: No lower extremity edema, non tender, no erythema  Right hip exam shows some tenderness to palpation over the greater trochanteric area. Patient does have significant loss of lordosis of the neck.  Patient does have limited range of motion of the neck in all planes.  Only 5 degrees of extension.  Significant increase in lordosis of the cervical spine 4-5 strength of the upper extremity but does seem to be symmetric.   Impression and Recommendations:    The above documentation has been reviewed and is accurate and complete Judi Saa, DO

## 2023-11-06 ENCOUNTER — Encounter: Payer: Self-pay | Admitting: Family Medicine

## 2023-11-06 ENCOUNTER — Ambulatory Visit (INDEPENDENT_AMBULATORY_CARE_PROVIDER_SITE_OTHER): Payer: Medicare Other | Admitting: Family Medicine

## 2023-11-06 ENCOUNTER — Ambulatory Visit (INDEPENDENT_AMBULATORY_CARE_PROVIDER_SITE_OTHER): Payer: Medicare Other

## 2023-11-06 VITALS — BP 96/69 | HR 81 | Ht 61.0 in | Wt 104.0 lb

## 2023-11-06 DIAGNOSIS — M2550 Pain in unspecified joint: Secondary | ICD-10-CM

## 2023-11-06 DIAGNOSIS — M542 Cervicalgia: Secondary | ICD-10-CM

## 2023-11-06 DIAGNOSIS — E559 Vitamin D deficiency, unspecified: Secondary | ICD-10-CM

## 2023-11-06 DIAGNOSIS — M797 Fibromyalgia: Secondary | ICD-10-CM | POA: Diagnosis not present

## 2023-11-06 DIAGNOSIS — M810 Age-related osteoporosis without current pathological fracture: Secondary | ICD-10-CM

## 2023-11-06 DIAGNOSIS — M5030 Other cervical disc degeneration, unspecified cervical region: Secondary | ICD-10-CM | POA: Insufficient documentation

## 2023-11-06 LAB — FERRITIN: Ferritin: 35.8 ng/mL (ref 10.0–291.0)

## 2023-11-06 LAB — COMPREHENSIVE METABOLIC PANEL
ALT: 15 U/L (ref 0–35)
AST: 29 U/L (ref 0–37)
Albumin: 5.1 g/dL (ref 3.5–5.2)
Alkaline Phosphatase: 42 U/L (ref 39–117)
BUN: 15 mg/dL (ref 6–23)
CO2: 30 meq/L (ref 19–32)
Calcium: 10.3 mg/dL (ref 8.4–10.5)
Chloride: 103 meq/L (ref 96–112)
Creatinine, Ser: 0.74 mg/dL (ref 0.40–1.20)
GFR: 79.56 mL/min (ref >60.00)
Glucose, Bld: 89 mg/dL (ref 70–99)
Potassium: 3.8 meq/L (ref 3.5–5.1)
Sodium: 140 meq/L (ref 135–145)
Total Bilirubin: 0.5 mg/dL (ref 0.2–1.2)
Total Protein: 8 g/dL (ref 6.0–8.3)

## 2023-11-06 LAB — IBC PANEL
Iron: 139 ug/dL (ref 42–145)
Saturation Ratios: 33.1 % (ref 20.0–50.0)
TIBC: 420 ug/dL (ref 250.0–450.0)
Transferrin: 300 mg/dL (ref 212.0–360.0)

## 2023-11-06 LAB — URIC ACID: Uric Acid, Serum: 7.5 mg/dL — ABNORMAL HIGH (ref 2.4–7.0)

## 2023-11-06 LAB — VITAMIN D 25 HYDROXY (VIT D DEFICIENCY, FRACTURES): VITD: 44.34 ng/mL (ref 30.00–100.00)

## 2023-11-06 LAB — TSH: TSH: 2.02 u[IU]/mL (ref 0.35–5.50)

## 2023-11-06 LAB — VITAMIN B12: Vitamin B-12: 1490 pg/mL — ABNORMAL HIGH (ref 211–911)

## 2023-11-06 NOTE — Assessment & Plan Note (Signed)
Patient does have severe arthritis and does have some acute changes that are likely secondary to some traumatic arthritic changes noted as well.  Will get laboratory workup to make sure nothing else is potentially contributing.  Discussed icing regimen and home exercises, patient will start with formal physical therapy which I think will be more beneficial.  We discussed different medications including the possibility of gabapentin which patient declined.  Follow-up with me again in 6 to 8 weeks.  If continuing to have difficulty advanced imaging would be warranted.

## 2023-11-06 NOTE — Assessment & Plan Note (Signed)
Patient is due for another bone density in February.

## 2023-11-06 NOTE — Patient Instructions (Addendum)
Do prescribed exercises at least 3x a week PT referral Labs today See you again in 2 months

## 2023-11-08 ENCOUNTER — Other Ambulatory Visit: Payer: Self-pay

## 2023-11-08 ENCOUNTER — Ambulatory Visit: Payer: Medicare Other | Attending: Family Medicine

## 2023-11-08 DIAGNOSIS — M542 Cervicalgia: Secondary | ICD-10-CM | POA: Diagnosis present

## 2023-11-08 DIAGNOSIS — M6281 Muscle weakness (generalized): Secondary | ICD-10-CM | POA: Diagnosis present

## 2023-11-08 DIAGNOSIS — R293 Abnormal posture: Secondary | ICD-10-CM | POA: Diagnosis present

## 2023-11-08 DIAGNOSIS — R252 Cramp and spasm: Secondary | ICD-10-CM | POA: Diagnosis present

## 2023-11-08 LAB — PTH, INTACT AND CALCIUM
Calcium: 10.3 mg/dL (ref 8.6–10.4)
PTH: 24 pg/mL (ref 16–77)

## 2023-11-08 NOTE — Therapy (Signed)
OUTPATIENT PHYSICAL THERAPY CERVICAL EVALUATION   Patient Name: Paige Suarez MRN: [id number] DOB:[birth year], 73 y.o., female Today's Date: 11/08/2023  END OF SESSION:  PT End of Session - 11/08/23 0807     Visit Number 1    Date for PT Re-Evaluation 01/03/24    Authorization Type Medicare    Progress Note Due on Visit 10    PT Start Time 0804    PT Stop Time 0850    PT Time Calculation (min) 46 min    Activity Tolerance Patient tolerated treatment well    Behavior During Therapy [facility] for tasks assessed/performed             Past Medical History:  Diagnosis Date   Anxiety    Arthritis    Cataract 072024   No surgery yet   Osteopenia    Osteoporosis    Past Surgical History:  Procedure Laterality Date   ESOPHAGOGASTRODUODENOSCOPY  09/27/2017   small hiatal hernia, reflux, esophagitis, erosive gastropathy   EYE SURGERY     Lasik   ovarectomy     benign   WRIST SURGERY Right    ligament tear, repaired   Patient Active Problem List   Diagnosis Date Noted   Degenerative disc disease, cervical 11/06/2023   Generalized anxiety disorder 05/10/2023   Age-related osteoporosis without current pathological fracture 02/20/2020   Anxiety 02/06/2017    PCP: Markus Daft  REFERRING PROVIDER: Judi Saa, DO  REFERRING DIAG: Joselyn Arrow, M   THERAPY DIAG:  Cervicalgia - Plan: PT plan of care cert/re-cert  Muscle weakness (generalized) - Plan: PT plan of care cert/re-cert  Cramp and spasm - Plan: PT plan of care cert/re-cert  Abnormal posture - Plan: PT plan of care cert/re-cert  Rationale for Evaluation and Treatment: Rehabilitation  ONSET DATE: 11/06/2023  SUBJECTIVE:                                                                                                                                                                                                         SUBJECTIVE STATEMENT: Patient states she just moved here.  She had to move to be near  her daughters.  She lives with her spouse.  He is not very active but she goes dancing in [place].  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  Admits she is under a lot of stress from the move and being in a new area.  One daughter has stopped speaking to her and the other daughter is  getting divorced.  She c/o neck pain with turning to look over her shoulders and upon waking in the morning.  She denies any sleep disturbance but does have the initial pain when she starts to rise in the morning.  She hopes to gain control of her neck pain and be able to enjoy dancing and being active.    Hand dominance: Right  PERTINENT HISTORY:  None  PAIN:  Are you having pain? Yes: NPRS scale: 8/10 Pain location: bilateral Pain description: tight, aching, sharp at times, headache pain Aggravating factors: stress Relieving factors: medication, ice, heat  PRECAUTIONS: None  RED FLAGS: None     WEIGHT BEARING RESTRICTIONS: No  FALLS:  Has patient fallen in last 6 months? No  LIVING ENVIRONMENT: Lives with: lives with their spouse Lives in: House/apartment   OCCUPATION: Retired  PLOF: Independent, Independent with basic ADLs, Independent with household mobility without device, Independent with community mobility without device, Independent with homemaking with ambulation, Independent with gait, and Independent with transfers  PATIENT GOALS:  She hopes to gain control of her neck pain and be able to enjoy dancing and being active.  NEXT MD VISIT: prn  OBJECTIVE:  Note: Objective measures were completed at Evaluation unless otherwise noted.  DIAGNOSTIC FINDINGS:  na  PATIENT SURVEYS:  FOTO 51, predicted 32  COGNITION: Overall cognitive status: Within functional limits for tasks assessed  SENSATION: WFL  POSTURE: rounded shoulders and forward head  PALPATION: Extremely tight upper traps with multiple trigger points and taut bands.  Also in  the splenius capitus and cervicis.  Tender at suboccipital area.    CERVICAL ROM:   Active ROM A/PROM (deg) eval  Flexion 50  Extension 35  Right lateral flexion 30  Left lateral flexion 30  Right rotation 40  Left rotation 30   (Blank rows = not tested)  UPPER EXTREMITY ROM:  WFL  UPPER EXTREMITY MMT:  All 5/5 with exception of bilateral shoulder ER 4-/5  CERVICAL SPECIAL TESTS:  Spurling's test: Negative   TREATMENT DATE: 11/08/23  Initial eval completed Reviewed HEP supplied by MD Manual : sub occipital release, PROM C spine, upper trap stretch, levator stretch, STM to upper traps, levator and cervical paraspinals x 10 min (placed moist heat at cervical area briefly, prior to manual techniques, this was left in place while doing manual techniques) Educated in DN and handout provided                                                                                                                                 PATIENT EDUCATION:  Education details: Initiated HEP of chin retraction and re-taught exercises given by MD office, educated on DN and handout provided Person educated: Patient Education method: Programmer, multimedia, Facilities manager, Verbal cues, and Handouts Education comprehension: verbalized understanding, returned demonstration, and verbal cues required  HOME EXERCISE PROGRAM: MD had given seated WITY, doorway stretch and serratus punches in supine, added cervical  retraction   ASSESSMENT:  CLINICAL IMPRESSION: Patient is a 73 y.o. female who was seen today for physical therapy evaluation and treatment for cervical pain and headaches.   She presents with decreased C spine ROM, postural weakness, and elevated pain along with trigger points and taut bands in the upper traps, levators, and cervical area.  She would benefit from skilled PT for DN, C spine ROM, postural strengthening and modalities for pain control.    OBJECTIVE IMPAIRMENTS: decreased ROM, decreased  strength, dizziness, hypomobility, increased fascial restrictions, increased muscle spasms, impaired flexibility, impaired UE functional use, postural dysfunction, and pain.   ACTIVITY LIMITATIONS: carrying, lifting, sleeping, transfers, bed mobility, reach over head, hygiene/grooming, and caring for others  PARTICIPATION LIMITATIONS: meal prep, cleaning, laundry, driving, shopping, community activity, yard work, and church  PERSONAL FACTORS: Age, Past/current experiences, and Time since onset of injury/illness/exacerbation are also affecting patient's functional outcome.   REHAB POTENTIAL: Good  CLINICAL DECISION MAKING: Stable/uncomplicated  EVALUATION COMPLEXITY: Low   GOALS: Goals reviewed with patient? Yes  SHORT TERM GOALS: Target date: 12/06/2023   Pain report to be no greater than 4/10  Baseline:  Goal status: INITIAL  2.  Patient will be independent with initial HEP  Baseline:  Goal status: INITIAL   LONG TERM GOALS: Target date: 01/03/2024   Patient to report pain no greater than 2/10  Baseline:  Goal status: INITIAL  2.  Patient to be independent with advanced HEP  Baseline:  Goal status: INITIAL  3.  Patient to be able to wake and rise with pain no greater than 2/10 Baseline:  Goal status: INITIAL  4.  C spine ROM to improve by 5-8 degrees on rotation and side bending Baseline:  Goal status: INITIAL  5.  Patient to report 85% improvement in overall symptoms  Baseline:  Goal status: INITIAL  6.  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 Baseline:  Goal status: INITIAL   PLAN:  PT FREQUENCY: 2x/week  PT DURATION: 8 weeks  PLANNED INTERVENTIONS: 97110-Therapeutic exercises, 97530- Therapeutic activity, [id number]- Neuromuscular re-education, 97535- Self Care, [id number]- Manual therapy, [id number]- Canalith repositioning, [id number]- Aquatic Therapy, 97014- Electrical stimulation (unattended), [id number]- Electrical stimulation  (manual), [id number]- Vasopneumatic device, [id number]- Ultrasound, [id number]- Traction (mechanical), [id number]- Ionotophoresis 4mg /ml Dexamethasone, Patient/Family education, Balance training, Stair training, Taping, Dry Needling, Joint mobilization, Spinal mobilization, Vestibular training, Visual/preceptual remediation/compensation, Cryotherapy, and Moist heat  PLAN FOR NEXT SESSION: UBE, review HEP, Begin DN if patient agrees.    Victorino Dike B. Fleming Prill, PT 11/08/23 3:02 PM [facility] [address], [address] [place], [place] [address] Phone # [phone] Fax [phone]

## 2023-11-10 ENCOUNTER — Encounter: Payer: Self-pay | Admitting: Family Medicine

## 2023-11-14 ENCOUNTER — Ambulatory Visit: Payer: Medicare Other | Admitting: Physical Therapy

## 2023-11-14 DIAGNOSIS — M6281 Muscle weakness (generalized): Secondary | ICD-10-CM

## 2023-11-14 DIAGNOSIS — R252 Cramp and spasm: Secondary | ICD-10-CM

## 2023-11-14 DIAGNOSIS — M542 Cervicalgia: Secondary | ICD-10-CM | POA: Diagnosis not present

## 2023-11-14 NOTE — Therapy (Signed)
OUTPATIENT PHYSICAL THERAPY CERVICAL PROGRESS NOTE   Patient Name: Paige Suarez MRN: [id number] DOB:11/15/1950, 73 y.o., female Today's Date: 11/14/2023  END OF SESSION:  PT End of Session - 11/14/23 1220     Visit Number 2    Date for PT Re-Evaluation 01/03/24    Authorization Type Medicare    Progress Note Due on Visit 10    PT Start Time 1230    PT Stop Time 1315    PT Time Calculation (min) 45 min    Activity Tolerance Patient tolerated treatment well             Past Medical History:  Diagnosis Date   Anxiety    Arthritis    Cataract 072024   No surgery yet   Osteopenia    Osteoporosis    Past Surgical History:  Procedure Laterality Date   ESOPHAGOGASTRODUODENOSCOPY  09/27/2017   small hiatal hernia, reflux, esophagitis, erosive gastropathy   EYE SURGERY     Lasik   ovarectomy     benign   WRIST SURGERY Right    ligament tear, repaired   Patient Active Problem List   Diagnosis Date Noted   Degenerative disc disease, cervical 11/06/2023   Generalized anxiety disorder 05/10/2023   Age-related osteoporosis without current pathological fracture 02/20/2020   Anxiety 02/06/2017    PCP: Markus Daft  REFERRING PROVIDER: Judi Saa, DO  REFERRING DIAG: Joselyn Arrow, M   THERAPY DIAG:  Cervicalgia  Muscle weakness (generalized)  Cramp and spasm  Rationale for Evaluation and Treatment: Rehabilitation  ONSET DATE: 11/06/2023  SUBJECTIVE:                                                                                                                                                                                                         SUBJECTIVE STATEMENT: My husband looked up the DN and doesn't think I should do it but I hurt so much I would like to try a little bit. I had a massage yesterday and she said I had a lot of tightness.  The doctor said I have gout in my neck.       EVAL: Patient states she just moved here.  She had to move to  be near her daughters.  She lives with her spouse.  He is not very active but she goes dancing in [place].  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  Admits she is under a lot of stress from the move and being  in a new area.  One daughter has stopped speaking to her and the other daughter is getting divorced.  She c/o neck pain with turning to look over her shoulders and upon waking in the morning.  She denies any sleep disturbance but does have the initial pain when she starts to rise in the morning.  She hopes to gain control of her neck pain and be able to enjoy dancing and being active.    Hand dominance: Right  PERTINENT HISTORY:  None  PAIN:  Are you having pain? Yes: NPRS scale: 8/10 Pain location: bilateral Pain description: tight, aching, sharp at times, headache pain Aggravating factors: stress Relieving factors: medication, ice, heat  PRECAUTIONS: None  RED FLAGS: None     WEIGHT BEARING RESTRICTIONS: No  FALLS:  Has patient fallen in last 6 months? No  LIVING ENVIRONMENT: Lives with: lives with their spouse Lives in: House/apartment   OCCUPATION: Retired  PLOF: Independent, Independent with basic ADLs, Independent with household mobility without device, Independent with community mobility without device, Independent with homemaking with ambulation, Independent with gait, and Independent with transfers  PATIENT GOALS:  She hopes to gain control of her neck pain and be able to enjoy dancing and being active.  NEXT MD VISIT: prn  OBJECTIVE:  Note: Objective measures were completed at Evaluation unless otherwise noted.  DIAGNOSTIC FINDINGS:  na  PATIENT SURVEYS:  FOTO 4, predicted 76  COGNITION: Overall cognitive status: Within functional limits for tasks assessed  SENSATION: WFL  POSTURE: rounded shoulders and forward head  PALPATION: Extremely tight upper traps with multiple trigger points and taut bands.   Also in the splenius capitus and cervicis.  Tender at suboccipital area.    CERVICAL ROM:   Active ROM A/PROM (deg) eval  Flexion 50  Extension 35  Right lateral flexion 30  Left lateral flexion 30  Right rotation 40  Left rotation 30   (Blank rows = not tested)  UPPER EXTREMITY ROM:  WFL  UPPER EXTREMITY MMT:  All 5/5 with exception of bilateral shoulder ER 4-/5  CERVICAL SPECIAL TESTS:  Spurling's test: Negative  TREATMENT DATE: 11/14/23  Cervical ROM (added to HEP) Cervical SNAG (added to HEP) Seated thoracic extension with towel roll 10x to each side (added to HEP) Manual therapy: soft tissue mobilization to bil cervical paraspinals Trigger Point Dry Needling Initial Treatment: Pt instructed on Dry Needling rational, procedures, and possible side effects. Pt instructed to expect mild to moderate muscle soreness later in the day and/or into the next day.  Pt instructed in methods to reduce muscle soreness. Pt instructed to continue prescribed HEP. Because Dry Needling was performed over or adjacent to a lung field, pt was educated on S/S of pneumothorax and to seek immediate medical attention should they occur.  Patient verbalized understanding of these instructions and education.  Patient Verbal Consent Given: Yes Education Handout Provided: Yes Muscles Treated: bil cervical multifid marination Electrical Stimulation Performed: No Treatment Response/Outcome: improved soft tissue mobility    TREATMENT DATE: 11/08/23  Initial eval completed Reviewed HEP supplied by MD Manual : sub occipital release, PROM C spine, upper trap stretch, levator stretch, STM to upper traps, levator and cervical paraspinals x 10 min (placed moist heat at cervical area briefly, prior to manual techniques, this was left in place while doing manual techniques) Educated in DN and handout provided  PATIENT EDUCATION:  Education details: Initiated HEP of chin retraction and re-taught exercises given by MD office, educated on DN and handout provided Person educated: Patient Education method: Programmer, multimedia, Demonstration, Verbal cues, and Handouts Education comprehension: verbalized understanding, returned demonstration, and verbal cues required  HOME EXERCISE PROGRAM: MD had given seated WITY, doorway stretch and serratus punches in supine, added cervical retraction  Access Code: [facility] URL: [email or web address] Date: 11/14/2023 Prepared by: Lavinia Sharps  Exercises - Seated Assisted Cervical Rotation with Towel  - 1 x daily - 7 x weekly - 1 sets - 10 reps - Seated Thoracic Lumbar Extension  - 1 x daily - 7 x weekly - 1 sets - 10 reps - Seated Cervical Rotation AROM  - 1 x daily - 7 x weekly - 1 sets - 5 reps - Seated Cervical Sidebending AROM  - 1 x daily - 7 x weekly - 1 sets - 5 reps  ASSESSMENT:  CLINICAL IMPRESSION: Some fearfulness of DN so performed in limited amount today.  The patient had a positive initial response to DN with much improved soft tissue mobility   Therapist monitoring response and educating patient on what to expect following DN and how to optimize benefit with specific exercise per HEP.  She responds well to these basic ex's as well.  Therapist monitoring response to all interventions and modifying treatment accordingly.    OBJECTIVE IMPAIRMENTS: decreased ROM, decreased strength, dizziness, hypomobility, increased fascial restrictions, increased muscle spasms, impaired flexibility, impaired UE functional use, postural dysfunction, and pain.   ACTIVITY LIMITATIONS: carrying, lifting, sleeping, transfers, bed mobility, reach over head, hygiene/grooming, and caring for others  PARTICIPATION LIMITATIONS: meal prep, cleaning, laundry, driving, shopping, community activity, yard work, and church  PERSONAL FACTORS:  Age, Past/current experiences, and Time since onset of injury/illness/exacerbation are also affecting patient's functional outcome.   REHAB POTENTIAL: Good  CLINICAL DECISION MAKING: Stable/uncomplicated  EVALUATION COMPLEXITY: Low   GOALS: Goals reviewed with patient? Yes  SHORT TERM GOALS: Target date: 12/06/2023   Pain report to be no greater than 4/10  Baseline:  Goal status: INITIAL  2.  Patient will be independent with initial HEP  Baseline:  Goal status: INITIAL   LONG TERM GOALS: Target date: 01/03/2024   Patient to report pain no greater than 2/10  Baseline:  Goal status: INITIAL  2.  Patient to be independent with advanced HEP  Baseline:  Goal status: INITIAL  3.  Patient to be able to wake and rise with pain no greater than 2/10 Baseline:  Goal status: INITIAL  4.  C spine ROM to improve by 5-8 degrees on rotation and side bending Baseline:  Goal status: INITIAL  5.  Patient to report 85% improvement in overall symptoms  Baseline:  Goal status: INITIAL  6.  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 Baseline:  Goal status: INITIAL   PLAN:  PT FREQUENCY: 2x/week  PT DURATION: 8 weeks  PLANNED INTERVENTIONS: 97110-Therapeutic exercises, 97530- Therapeutic activity, 97112- Neuromuscular re-education, 97535- Self Care, 96045- Manual therapy, [id number]- Canalith repositioning, [id number]- Aquatic Therapy, 97014- Electrical stimulation (unattended), [id number]- Electrical stimulation (manual), [id number]- Vasopneumatic device, [id number]- Ultrasound, [id number]- Traction (mechanical), [id number]- Ionotophoresis 4mg /ml Dexamethasone, Patient/Family education, Balance training, Stair training, Taping, Dry Needling, Joint mobilization, Spinal mobilization, Vestibular training, Visual/preceptual remediation/compensation, Cryotherapy, and Moist heat  PLAN FOR NEXT SESSION: assess response to DN#1 and if favorable add upper traps and suboccipitals;  UBE, review HEP and progress;  upcoming trip to  [place]  Shandy Vi, PT 11/14/23 2:08 PM Phone: [phone] Fax: [phone]

## 2023-11-14 NOTE — Patient Instructions (Signed)

## 2023-11-20 ENCOUNTER — Ambulatory Visit: Payer: Medicare Other | Attending: Family Medicine

## 2023-11-20 DIAGNOSIS — M542 Cervicalgia: Secondary | ICD-10-CM | POA: Diagnosis present

## 2023-11-20 DIAGNOSIS — M6281 Muscle weakness (generalized): Secondary | ICD-10-CM | POA: Insufficient documentation

## 2023-11-20 DIAGNOSIS — R293 Abnormal posture: Secondary | ICD-10-CM | POA: Insufficient documentation

## 2023-11-20 DIAGNOSIS — R252 Cramp and spasm: Secondary | ICD-10-CM | POA: Diagnosis present

## 2023-11-20 NOTE — Therapy (Signed)
 OUTPATIENT PHYSICAL THERAPY CERVICAL TREATMENT   Patient Name: Paige Suarez MRN: [id number] DOB:1950-11-08, 73 y.o., female Today's Date: 11/20/2023  END OF SESSION:  PT End of Session - 11/20/23 1019     Visit Number 3    Date for PT Re-Evaluation 01/03/24    Authorization Type Medicare    Progress Note Due on Visit 10    PT Start Time 1019    PT Stop Time 1105    PT Time Calculation (min) 46 min    Activity Tolerance Patient tolerated treatment well    Behavior During Therapy [facility] for tasks assessed/performed             Past Medical History:  Diagnosis Date   Anxiety    Arthritis    Cataract [id number]   No surgery yet   Osteopenia    Osteoporosis    Past Surgical History:  Procedure Laterality Date   ESOPHAGOGASTRODUODENOSCOPY  09/27/2017   small hiatal hernia, reflux, esophagitis, erosive gastropathy   EYE SURGERY     Lasik   ovarectomy     benign   WRIST SURGERY Right    ligament tear, repaired   Patient Active Problem List   Diagnosis Date Noted   Degenerative disc disease, cervical 11/06/2023   Generalized anxiety disorder 05/10/2023   Age-related osteoporosis without current pathological fracture 02/20/2020   Anxiety 02/06/2017    PCP: Randol Annabelle HERO  REFERRING PROVIDER: Claudene Arthea HERO, DO  REFERRING DIAG: Randol Annabelle, M   THERAPY DIAG:  Abnormal posture  Cervicalgia  Cramp and spasm  Muscle weakness (generalized)  Rationale for Evaluation and Treatment: Rehabilitation  ONSET DATE: 11/06/2023  SUBJECTIVE:                                                                                                                                                                                                         SUBJECTIVE STATEMENT: Patient reports she did well with the dry needling but was very apprehensive.  I felt really good for 2 days but then I started hurting again.  Would like to try it again.      EVAL: Patient states she just  moved here.  She had to move to be near her daughters.  She lives with her spouse.  He is not very active but she goes dancing in [place].  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  Admits she is under a lot of stress from the move and being in a new area.  One daughter has stopped speaking to her and the other daughter is getting divorced.  She c/o neck pain with turning to look over her shoulders and upon waking in the morning.  She denies any sleep disturbance but does have the initial pain when she starts to rise in the morning.  She hopes to gain control of her neck pain and be able to enjoy dancing and being active.    Hand dominance: Right  PERTINENT HISTORY:  None  PAIN:  11/20/23 Are you having pain? Yes: NPRS scale: 7/10 Pain location: bilateral Pain description: tight, aching, sharp at times, headache pain Aggravating factors: stress Relieving factors: medication, ice, heat  PRECAUTIONS: None  RED FLAGS: None     WEIGHT BEARING RESTRICTIONS: No  FALLS:  Has patient fallen in last 6 months? No  LIVING ENVIRONMENT: Lives with: lives with their spouse Lives in: House/apartment   OCCUPATION: Retired  PLOF: Independent, Independent with basic ADLs, Independent with household mobility without device, Independent with community mobility without device, Independent with homemaking with ambulation, Independent with gait, and Independent with transfers  PATIENT GOALS:  She hopes to gain control of her neck pain and be able to enjoy dancing and being active.  NEXT MD VISIT: prn  OBJECTIVE:  Note: Objective measures were completed at Evaluation unless otherwise noted.  DIAGNOSTIC FINDINGS:  na  PATIENT SURVEYS:  FOTO 36, predicted 40  COGNITION: Overall cognitive status: Within functional limits for tasks assessed  SENSATION: WFL  POSTURE: rounded shoulders and forward head  PALPATION: Extremely tight upper traps with  multiple trigger points and taut bands.  Also in the splenius capitus and cervicis.  Tender at suboccipital area.    CERVICAL ROM:   Active ROM A/PROM (deg) eval  Flexion 50  Extension 35  Right lateral flexion 30  Left lateral flexion 30  Right rotation 40  Left rotation 30   (Blank rows = not tested)  UPPER EXTREMITY ROM:  WFL  UPPER EXTREMITY MMT:  All 5/5 with exception of bilateral shoulder ER 4-/5  CERVICAL SPECIAL TESTS:  Spurling's test: Negative  TREATMENT DATE: 11/20/23  Cervical ROM (added to HEP) Postural education using mirror: visual feedback to allow patient to correct head position into alignment and to avoid fwd head/rounded shoulders Educated on the effect of emotional stress on health and muscle tension.   Manual therapy: soft tissue mobilization to bil cervical paraspinals Trigger Point Dry Needling Initial Treatment: Pt instructed on Dry Needling rational, procedures, and possible side effects. Pt instructed to expect mild to moderate muscle soreness later in the day and/or into the next day.  Pt instructed in methods to reduce muscle soreness. Pt instructed to continue prescribed HEP. Because Dry Needling was performed over or adjacent to a lung field, pt was educated on S/S of pneumothorax and to seek immediate medical attention should they occur.  Patient verbalized understanding of these instructions and education.  Patient Verbal Consent Given: Yes Education Handout Provided: Yes Muscles Treated: bil bilateral upper traps, cervical multifid marination and pistoning,  and sub occipitals pistoning Electrical Stimulation Performed: No Treatment Response/Outcome: improved soft tissue mobility, slight improvement in cervical rotation  TREATMENT DATE: 11/14/23  Cervical ROM (added to HEP) Cervical SNAG (added to HEP) Seated thoracic extension with towel roll 10x to each side (added to HEP) Manual therapy: soft tissue mobilization to bil cervical  paraspinals Trigger Point Dry Needling Initial Treatment: Pt instructed on Dry Needling rational, procedures, and possible side effects. Pt instructed to expect  mild to moderate muscle soreness later in the day and/or into the next day.  Pt instructed in methods to reduce muscle soreness. Pt instructed to continue prescribed HEP. Because Dry Needling was performed over or adjacent to a lung field, pt was educated on S/S of pneumothorax and to seek immediate medical attention should they occur.  Patient verbalized understanding of these instructions and education.  Patient Verbal Consent Given: Yes Education Handout Provided: Yes Muscles Treated: bil cervical multifid marination Electrical Stimulation Performed: No Treatment Response/Outcome: improved soft tissue mobility    TREATMENT DATE: 11/08/23  Initial eval completed Reviewed HEP supplied by MD Manual : sub occipital release, PROM C spine, upper trap stretch, levator stretch, STM to upper traps, levator and cervical paraspinals x 10 min (placed moist heat at cervical area briefly, prior to manual techniques, this was left in place while doing manual techniques) Educated in DN and handout provided                                                                                                                                 PATIENT EDUCATION:  11/20/23 Education details: Educated on fish farm manager, effect of stress on health, and avoiding self directing her health care.   Person educated: Patient Education method: Explanation, Demonstration, Verbal cues, and Handouts Education comprehension: verbalized understanding, returned demonstration, and verbal cues required  HOME EXERCISE PROGRAM: MD had given seated WITY, doorway stretch and serratus punches in supine, added cervical retraction  Access Code: [facility] URL: [email or web address] Date: 11/14/2023 Prepared by: Glade Pesa  Exercises - Seated Assisted  Cervical Rotation with Towel  - 1 x daily - 7 x weekly - 1 sets - 10 reps - Seated Thoracic Lumbar Extension  - 1 x daily - 7 x weekly - 1 sets - 10 reps - Seated Cervical Rotation AROM  - 1 x daily - 7 x weekly - 1 sets - 5 reps - Seated Cervical Sidebending AROM  - 1 x daily - 7 x weekly - 1 sets - 5 reps  ASSESSMENT:  CLINICAL IMPRESSION: Taneshia responded well to DN and had 2 days of relief but had some recurrence of symptoms.  She seems to be experiencing heavy amounts of stress that are likely contributing to her symptoms.  She understands this and agrees.  She is also dealing with a long standing issue of becoming extremely dizzy if she uses a treadmill or  a bike, anything that moves without her feet planted on the ground.  However, she is able to go line dancing several nights a week and states she gets no dizziness whatsoever with constant spinning and turns.  We worked on a lot of education today.   She has a long history of self directing and removing herself from medications at times.  Explained the disadvantage to the doctors treating her if they don't have a clear picture of her history and changes that she's made.  She had good twitch responses today during dry needling.  Post treatment, she reported relief of tension in the upper traps.  She had slight improvement in cervical rotation.  She would benefit from continued skilled PT for postural strengthening, DN and manual techniques to reach goals below.     OBJECTIVE IMPAIRMENTS: decreased ROM, decreased strength, dizziness, hypomobility, increased fascial restrictions, increased muscle spasms, impaired flexibility, impaired UE functional use, postural dysfunction, and pain.   ACTIVITY LIMITATIONS: carrying, lifting, sleeping, transfers, bed mobility, reach over head, hygiene/grooming, and caring for others  PARTICIPATION LIMITATIONS: meal prep, cleaning, laundry, driving, shopping, community activity, yard work, and church  PERSONAL  FACTORS: Age, Past/current experiences, and Time since onset of injury/illness/exacerbation are also affecting patient's functional outcome.   REHAB POTENTIAL: Good  CLINICAL DECISION MAKING: Stable/uncomplicated  EVALUATION COMPLEXITY: Low   GOALS: Goals reviewed with patient? Yes  SHORT TERM GOALS: Target date: 12/06/2023   Pain report to be no greater than 4/10  Baseline:  Goal status: INITIAL  2.  Patient will be independent with initial HEP  Baseline:  Goal status: INITIAL   LONG TERM GOALS: Target date: 01/03/2024   Patient to report pain no greater than 2/10  Baseline:  Goal status: INITIAL  2.  Patient to be independent with advanced HEP  Baseline:  Goal status: INITIAL  3.  Patient to be able to wake and rise with pain no greater than 2/10 Baseline:  Goal status: INITIAL  4.  C spine ROM to improve by 5-8 degrees on rotation and side bending Baseline:  Goal status: INITIAL  5.  Patient to report 85% improvement in overall symptoms  Baseline:  Goal status: INITIAL  6.  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 Baseline:  Goal status: INITIAL   PLAN:  PT FREQUENCY: 2x/week  PT DURATION: 8 weeks  PLANNED INTERVENTIONS: 97110-Therapeutic exercises, 97530- Therapeutic activity, [id number]- Neuromuscular re-education, 97535- Self Care, 02859- Manual therapy, [id number]- Canalith repositioning, [id number]- Aquatic Therapy, [id number]- Electrical stimulation (unattended), [id number]- Electrical stimulation (manual), [id number]- Vasopneumatic device, [id number]- Ultrasound, [id number]- Traction (mechanical), [id number]- Ionotophoresis 4mg /ml Dexamethasone, Patient/Family education, Balance training, Stair training, Taping, Dry Needling, Joint mobilization, Spinal mobilization, Vestibular training, Visual/preceptual remediation/compensation, Cryotherapy, and Moist heat  PLAN FOR NEXT SESSION: assess response to DN#2 and if favorable proceed accordingly; UBE,  review HEP and progress;  upcoming trip to [place]  Gabe Glace B. Algis Lehenbauer, PT 11/20/23 3:20 PM [facility] [address] [place], [place] [address] Phone # [phone] Fax [phone]

## 2023-11-22 ENCOUNTER — Ambulatory Visit: Payer: Medicare Other | Admitting: Physical Therapy

## 2023-11-22 DIAGNOSIS — R252 Cramp and spasm: Secondary | ICD-10-CM

## 2023-11-22 DIAGNOSIS — M6281 Muscle weakness (generalized): Secondary | ICD-10-CM

## 2023-11-22 DIAGNOSIS — M542 Cervicalgia: Secondary | ICD-10-CM

## 2023-11-22 DIAGNOSIS — R293 Abnormal posture: Secondary | ICD-10-CM

## 2023-11-22 NOTE — Therapy (Signed)
 OUTPATIENT PHYSICAL THERAPY CERVICAL TREATMENT   Patient Name: Paige Suarez MRN: [id number] DOB:1951-08-22, 73 y.o., female Today's Date: 11/22/2023  END OF SESSION:  PT End of Session - 11/22/23 1230     Visit Number 4    Date for PT Re-Evaluation 01/03/24    Authorization Type Medicare    Progress Note Due on Visit 10    PT Start Time 1230    PT Stop Time 1314    PT Time Calculation (min) 44 min    Activity Tolerance Patient tolerated treatment well             Past Medical History:  Diagnosis Date   Anxiety    Arthritis    Cataract [id number]   No surgery yet   Osteopenia    Osteoporosis    Past Surgical History:  Procedure Laterality Date   ESOPHAGOGASTRODUODENOSCOPY  09/27/2017   small hiatal hernia, reflux, esophagitis, erosive gastropathy   EYE SURGERY     Lasik   ovarectomy     benign   WRIST SURGERY Right    ligament tear, repaired   Patient Active Problem List   Diagnosis Date Noted   Degenerative disc disease, cervical 11/06/2023   Generalized anxiety disorder 05/10/2023   Age-related osteoporosis without current pathological fracture 02/20/2020   Anxiety 02/06/2017    PCP: Randol Annabelle HERO  REFERRING PROVIDER: Claudene Arthea HERO, DO  REFERRING DIAG: Randol Annabelle, M   THERAPY DIAG:  Abnormal posture  Cervicalgia  Cramp and spasm  Muscle weakness (generalized)  Rationale for Evaluation and Treatment: Rehabilitation  ONSET DATE: 11/06/2023  SUBJECTIVE:                                                                                                                                                                                                         SUBJECTIVE STATEMENT: I really like the DN.  I don't have the severe pain like I did.  I can turn more but I feel tight on the right side of my neck.  I've been doing the ex's religiously.  I feel it toward the end of the day.  I can lift my head up off the pillow to get out of bed now and not hold  my head.  I had zero relief from the chiro. Leaving for [place] and gone for 2 weeks.     EVAL: Patient states she just moved here.  She had to move to be near her daughters.  She lives with her spouse.  He is not very active but she goes dancing  in [place].  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  Admits she is under a lot of stress from the move and being in a new area.  One daughter has stopped speaking to her and the other daughter is getting divorced.  She c/o neck pain with turning to look over her shoulders and upon waking in the morning.  She denies any sleep disturbance but does have the initial pain when she starts to rise in the morning.  She hopes to gain control of her neck pain and be able to enjoy dancing and being active.    Hand dominance: Right  PERTINENT HISTORY:  None  PAIN:  Are you having pain? Yes: NPRS scale: 7/10 Pain location: bilateral Pain description: tight, aching, sharp at times, headache pain Aggravating factors: stress Relieving factors: medication, ice, heat  PRECAUTIONS: None  RED FLAGS: None     WEIGHT BEARING RESTRICTIONS: No  FALLS:  Has patient fallen in last 6 months? No  LIVING ENVIRONMENT: Lives with: lives with their spouse Lives in: House/apartment   OCCUPATION: Retired  PLOF: Independent, Independent with basic ADLs, Independent with household mobility without device, Independent with community mobility without device, Independent with homemaking with ambulation, Independent with gait, and Independent with transfers  PATIENT GOALS:  She hopes to gain control of her neck pain and be able to enjoy dancing and being active.  NEXT MD VISIT: prn  OBJECTIVE:  Note: Objective measures were completed at Evaluation unless otherwise noted.  DIAGNOSTIC FINDINGS:  na  PATIENT SURVEYS:  FOTO 34, predicted 45  COGNITION: Overall cognitive status: Within functional limits for tasks  assessed  SENSATION: WFL  POSTURE: rounded shoulders and forward head  PALPATION: Extremely tight upper traps with multiple trigger points and taut bands.  Also in the splenius capitus and cervicis.  Tender at suboccipital area.    CERVICAL ROM:   Active ROM A/PROM (deg) eval  Flexion 50  Extension 35  Right lateral flexion 30  Left lateral flexion 30  Right rotation 40  Left rotation 30   (Blank rows = not tested)  UPPER EXTREMITY ROM:  WFL  UPPER EXTREMITY MMT:  All 5/5 with exception of bilateral shoulder ER 4-/5  CERVICAL SPECIAL TESTS:  Spurling's test: Negative  TREATMENT DATE: 11/22/23  Review of current HEP with evidence of good compliance Foam roll melt method: cervical rotation, circles, nods 8-10x each Cervical isometrics  seated 5 sec hold 5x rotation, sidebending, flexion and extension (added to HEP) Manual therapy: soft tissue mobilization to bil cervical paraspinals, right upper trap and levator scap Trigger Point Dry Needling Initial Treatment: Pt instructed on Dry Needling rational, procedures, and possible side effects. Pt instructed to expect mild to moderate muscle soreness later in the day and/or into the next day.  Pt instructed in methods to reduce muscle soreness. Pt instructed to continue prescribed HEP. Because Dry Needling was performed over or adjacent to a lung field, pt was educated on S/S of pneumothorax and to seek immediate medical attention should they occur.  Patient verbalized understanding of these instructions and education.  Patient Verbal Consent Given: Yes Education Handout Provided: Yes Muscles Treated: right upper traps, right levator scap, right only cervical multifid marination  Electrical Stimulation Performed: No Treatment Response/Outcome: improved soft tissue mobility, slight improvement in cervical rotation  TREATMENT DATE: 11/20/23  Cervical ROM (added to HEP) Postural education using mirror: visual feedback to allow  patient to  correct head position into alignment and to avoid fwd head/rounded shoulders Educated on the effect of emotional stress on health and muscle tension.   Manual therapy: soft tissue mobilization to bil cervical paraspinals Trigger Point Dry Needling Initial Treatment: Pt instructed on Dry Needling rational, procedures, and possible side effects. Pt instructed to expect mild to moderate muscle soreness later in the day and/or into the next day.  Pt instructed in methods to reduce muscle soreness. Pt instructed to continue prescribed HEP. Because Dry Needling was performed over or adjacent to a lung field, pt was educated on S/S of pneumothorax and to seek immediate medical attention should they occur.  Patient verbalized understanding of these instructions and education.  Patient Verbal Consent Given: Yes Education Handout Provided: Yes Muscles Treated: bil bilateral upper traps, cervical multifid marination and pistoning,  and sub occipitals pistoning Electrical Stimulation Performed: No Treatment Response/Outcome: improved soft tissue mobility, slight improvement in cervical rotation  TREATMENT DATE: 11/14/23  Cervical ROM (added to HEP) Cervical SNAG (added to HEP) Seated thoracic extension with towel roll 10x to each side (added to HEP) Manual therapy: soft tissue mobilization to bil cervical paraspinals Trigger Point Dry Needling Initial Treatment: Pt instructed on Dry Needling rational, procedures, and possible side effects. Pt instructed to expect mild to moderate muscle soreness later in the day and/or into the next day.  Pt instructed in methods to reduce muscle soreness. Pt instructed to continue prescribed HEP. Because Dry Needling was performed over or adjacent to a lung field, pt was educated on S/S of pneumothorax and to seek immediate medical attention should they occur.  Patient verbalized understanding of these instructions and education.  Patient Verbal Consent  Given: Yes Education Handout Provided: Yes Muscles Treated: bil cervical multifid marination Electrical Stimulation Performed: No Treatment Response/Outcome: improved soft tissue mobility                                                        PATIENT EDUCATION:  11/20/23 Education details: Educated on fish farm manager, effect of stress on health, and avoiding self directing her health care.   Person educated: Patient Education method: Explanation, Demonstration, Verbal cues, and Handouts Education comprehension: verbalized understanding, returned demonstration, and verbal cues required  HOME EXERCISE PROGRAM: Access Code: [facility] URL: [email or web address] Date: 11/22/2023 Prepared by: Glade Pesa  Exercises - Seated Assisted Cervical Rotation with Towel  - 1 x daily - 7 x weekly - 1 sets - 10 reps - Seated Thoracic Lumbar Extension  - 1 x daily - 7 x weekly - 1 sets - 10 reps - Seated Cervical Rotation AROM  - 1 x daily - 7 x weekly - 1 sets - 5 reps - Seated Cervical Sidebending AROM  - 1 x daily - 7 x weekly - 1 sets - 5 reps - Seated Isometric Cervical Sidebending  - 1 x daily - 7 x weekly - 1 sets - 5 reps - 5 hold - Seated Isometric Cervical Extension  - 1 x daily - 7 x weekly - 1 sets - 5 reps - 5 hold - Seated Isometric Cervical Flexion  - 1 x daily - 7 x weekly - 1 sets - 5 reps - 5 hold - Seated Isometric Cervical Rotation  - 1 x daily - 7 x weekly - 1 sets - 5  reps - 5 hold MD had given seated WITY, doorway stretch and serratus punches in supine, added cervical retraction    ASSESSMENT:  CLINICAL IMPRESSION: Dynver has responded well to DN with reports of decreased intensity of pain.  She reports right neck tightness today limiting cervical ROM and since she is going out of the country for 2 weeks she feels further treatment in this area would be helpful.  She has a good twitch response in right upper trap which is a good indicator of benefit.  Added  cervical isometrics to HEP which will also provide an analgesic effect and improved cervical stability with supine to sit.    OBJECTIVE IMPAIRMENTS: decreased ROM, decreased strength, dizziness, hypomobility, increased fascial restrictions, increased muscle spasms, impaired flexibility, impaired UE functional use, postural dysfunction, and pain.   ACTIVITY LIMITATIONS: carrying, lifting, sleeping, transfers, bed mobility, reach over head, hygiene/grooming, and caring for others  PARTICIPATION LIMITATIONS: meal prep, cleaning, laundry, driving, shopping, community activity, yard work, and church  PERSONAL FACTORS: Age, Past/current experiences, and Time since onset of injury/illness/exacerbation are also affecting patient's functional outcome.   REHAB POTENTIAL: Good  CLINICAL DECISION MAKING: Stable/uncomplicated  EVALUATION COMPLEXITY: Low   GOALS: Goals reviewed with patient? Yes  SHORT TERM GOALS: Target date: 12/06/2023   Pain report to be no greater than 4/10  Baseline:  Goal status: INITIAL  2.  Patient will be independent with initial HEP  Baseline:  Goal status: INITIAL   LONG TERM GOALS: Target date: 01/03/2024   Patient to report pain no greater than 2/10  Baseline:  Goal status: INITIAL  2.  Patient to be independent with advanced HEP  Baseline:  Goal status: INITIAL  3.  Patient to be able to wake and rise with pain no greater than 2/10 Baseline:  Goal status: INITIAL  4.  C spine ROM to improve by 5-8 degrees on rotation and side bending Baseline:  Goal status: INITIAL  5.  Patient to report 85% improvement in overall symptoms  Baseline:  Goal status: INITIAL  6.  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 Baseline:  Goal status: INITIAL   PLAN:  PT FREQUENCY: 2x/week  PT DURATION: 8 weeks  PLANNED INTERVENTIONS: 97110-Therapeutic exercises, 97530- Therapeutic activity, 97112- Neuromuscular  re-education, 97535- Self Care, 02859- Manual therapy, [phone]- Canalith repositioning, [id number]- Aquatic Therapy, 97014- Electrical stimulation (unattended), [id number]- Electrical stimulation (manual), 97016- Vasopneumatic device, [id number]- Ultrasound, [id number]- Traction (mechanical), [id number]- Ionotophoresis 4mg /ml Dexamethasone, Patient/Family education, Balance training, Stair training, Taping, Dry Needling, Joint mobilization, Spinal mobilization, Vestibular training, Visual/preceptual remediation/compensation, Cryotherapy, and Moist heat  PLAN FOR NEXT SESSION: assess response to DN#3 follow up after trip to [place];  foam roll MELT; cervical isometrics; cervical SNAG; no UBE makes me nauseated   Glade Pesa, PT 11/22/23 6:34 PM Phone: [phone] Fax: [phone]  [facility] [address], [address] [place], [place] [address] Phone # [phone] Fax [phone]

## 2023-12-10 ENCOUNTER — Ambulatory Visit: Payer: Medicare Other | Admitting: "Endocrinology

## 2023-12-11 ENCOUNTER — Ambulatory Visit: Payer: Medicare Other

## 2023-12-11 ENCOUNTER — Ambulatory Visit: Payer: Self-pay | Admitting: Family Medicine

## 2023-12-11 DIAGNOSIS — R293 Abnormal posture: Secondary | ICD-10-CM | POA: Diagnosis not present

## 2023-12-11 DIAGNOSIS — R252 Cramp and spasm: Secondary | ICD-10-CM

## 2023-12-11 DIAGNOSIS — M6281 Muscle weakness (generalized): Secondary | ICD-10-CM

## 2023-12-11 DIAGNOSIS — M542 Cervicalgia: Secondary | ICD-10-CM

## 2023-12-11 NOTE — Telephone Encounter (Signed)
 2nd attempt at contacting patient.  Called her contact number and her husband's contact number.  Both numbers just came up with a dial tone. Unable to reach patient.

## 2023-12-11 NOTE — Therapy (Signed)
 OUTPATIENT PHYSICAL THERAPY CERVICAL TREATMENT   Patient Name: Paige Suarez MRN: [id number] DOB:1951/06/14, 73 y.o., female Today's Date: 12/11/2023  END OF SESSION:  PT End of Session - 12/11/23 0935     Visit Number 5    Date for PT Re-Evaluation 01/03/24    Authorization Type Medicare    PT Start Time 0932    PT Stop Time 1004    PT Time Calculation (min) 32 min    Activity Tolerance Patient tolerated treatment well    Behavior During Therapy [facility] for tasks assessed/performed             Past Medical History:  Diagnosis Date   Anxiety    Arthritis    Cataract [id number]   No surgery yet   Osteopenia    Osteoporosis    Past Surgical History:  Procedure Laterality Date   ESOPHAGOGASTRODUODENOSCOPY  09/27/2017   small hiatal hernia, reflux, esophagitis, erosive gastropathy   EYE SURGERY     Lasik   ovarectomy     benign   WRIST SURGERY Right    ligament tear, repaired   Patient Active Problem List   Diagnosis Date Noted   Degenerative disc disease, cervical 11/06/2023   Generalized anxiety disorder 05/10/2023   Age-related osteoporosis without current pathological fracture 02/20/2020   Anxiety 02/06/2017    PCP: Markus Daft  REFERRING PROVIDER: Judi Saa, DO  REFERRING DIAG: Joselyn Arrow, M   THERAPY DIAG:  Abnormal posture  Cervicalgia  Cramp and spasm  Muscle weakness (generalized)  Rationale for Evaluation and Treatment: Rehabilitation  ONSET DATE: 11/06/2023  SUBJECTIVE:                                                                                                                                                                                                         SUBJECTIVE STATEMENT: Patient states, "I feel terrible".  I was in pain while I was gone and had diarrhea 10 of the 12 days while I was in [place].    EVAL: Patient states she just moved here.  She had to move to be near her daughters.  She lives with her spouse.  He  is not very active but she goes dancing in [place].  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  Admits she is under a lot of stress from the move and being in a new area.  One daughter has stopped speaking to her and the other daughter is getting divorced.  She c/o neck  pain with turning to look over her shoulders and upon waking in the morning.  She denies any sleep disturbance but does have the initial pain when she starts to rise in the morning.  She hopes to gain control of her neck pain and be able to enjoy dancing and being active.    Hand dominance: Right  PERTINENT HISTORY:  None  PAIN:  12/11/23 Are you having pain? Yes: NPRS scale: 7/10 Pain location: bilateral Pain description: tight, aching, sharp at times, headache pain Aggravating factors: stress Relieving factors: medication, ice, heat  PRECAUTIONS: None  RED FLAGS: None     WEIGHT BEARING RESTRICTIONS: No  FALLS:  Has patient fallen in last 6 months? No  LIVING ENVIRONMENT: Lives with: lives with their spouse Lives in: House/apartment   OCCUPATION: Retired  PLOF: Independent, Independent with basic ADLs, Independent with household mobility without device, Independent with community mobility without device, Independent with homemaking with ambulation, Independent with gait, and Independent with transfers  PATIENT GOALS:  She hopes to gain control of her neck pain and be able to enjoy dancing and being active.  NEXT MD VISIT: prn  OBJECTIVE:  Note: Objective measures were completed at Evaluation unless otherwise noted.  DIAGNOSTIC FINDINGS:  na  PATIENT SURVEYS:  FOTO 65, predicted 47  COGNITION: Overall cognitive status: Within functional limits for tasks assessed  SENSATION: WFL  POSTURE: rounded shoulders and forward head  PALPATION: Extremely tight upper traps with multiple trigger points and taut bands.  Also in the splenius capitus and cervicis.   Tender at suboccipital area.    CERVICAL ROM:   Active ROM A/PROM (deg) eval  Flexion 50  Extension 35  Right lateral flexion 30  Left lateral flexion 30  Right rotation 40  Left rotation 30   (Blank rows = not tested)  UPPER EXTREMITY ROM:  WFL  UPPER EXTREMITY MMT:  All 5/5 with exception of bilateral shoulder ER 4-/5  CERVICAL SPECIAL TESTS:  Spurling's test: Negative  TREATMENT DATE:  12/11/23  Tband shoulder extension, rows, bilateral shoulder horizontal abduction and ER 2 x 10 Cervical ROM assessed Discussed sleeping positions: patient is not using a pillow Discussed, again, her stress level currently and need to discuss possible anti-anxiety low dose to see if it will help her with her transition to moving to new place and her conflicts with her daughter.  Trigger Point Dry Needling Initial Treatment: Pt instructed on Dry Needling rational, procedures, and possible side effects. Pt instructed to expect mild to moderate muscle soreness later in the day and/or into the next day.  Pt instructed in methods to reduce muscle soreness. Pt instructed to continue prescribed HEP. Because Dry Needling was performed over or adjacent to a lung field, pt was educated on S/S of pneumothorax and to seek immediate medical attention should they occur.  Patient verbalized understanding of these instructions and education.  Patient Verbal Consent Given: Yes Education Handout Provided: Yes Muscles Treated: bil bilateral upper traps, cervical multifid marination and pistoning,  and sub occipitals pistoning Electrical Stimulation Performed: No Treatment Response/Outcome: improved soft tissue mobility, slight improvement in cervical rotation. Patient reported decrease in pain.    11/22/23  Review of current HEP with evidence of good compliance Foam roll melt method: cervical rotation, circles, nods 8-10x each Cervical isometrics  seated 5 sec hold 5x rotation, sidebending, flexion and  extension (added to HEP) Manual therapy: soft tissue mobilization to bil cervical paraspinals, right upper trap and levator scap Trigger Point Dry  Needling Initial Treatment: Pt instructed on Dry Needling rational, procedures, and possible side effects. Pt instructed to expect mild to moderate muscle soreness later in the day and/or into the next day.  Pt instructed in methods to reduce muscle soreness. Pt instructed to continue prescribed HEP. Because Dry Needling was performed over or adjacent to a lung field, pt was educated on S/S of pneumothorax and to seek immediate medical attention should they occur.  Patient verbalized understanding of these instructions and education.  Patient Verbal Consent Given: Yes Education Handout Provided: Yes Muscles Treated: right upper traps, right levator scap, right only cervical multifid marination  Electrical Stimulation Performed: No Treatment Response/Outcome: improved soft tissue mobility, slight improvement in cervical rotation  TREATMENT DATE: 11/20/23  Cervical ROM (added to HEP) Postural education using mirror: visual feedback to allow patient to correct head position into alignment and to avoid fwd head/rounded shoulders Educated on the effect of emotional stress on health and muscle tension.   Manual therapy: soft tissue mobilization to bil cervical paraspinals Trigger Point Dry Needling Initial Treatment: Pt instructed on Dry Needling rational, procedures, and possible side effects. Pt instructed to expect mild to moderate muscle soreness later in the day and/or into the next day.  Pt instructed in methods to reduce muscle soreness. Pt instructed to continue prescribed HEP. Because Dry Needling was performed over or adjacent to a lung field, pt was educated on S/S of pneumothorax and to seek immediate medical attention should they occur.  Patient verbalized understanding of these instructions and education.  Patient Verbal Consent Given:  Yes Education Handout Provided: Yes Muscles Treated: bil bilateral upper traps, cervical multifid marination and pistoning,  and sub occipitals pistoning Electrical Stimulation Performed: No Treatment Response/Outcome: improved soft tissue mobility, slight improvement in cervical rotation  PATIENT EDUCATION:  11/20/23 Education details: Educated on Fish farm manager, effect of stress on health, and avoiding self directing her health care.   Person educated: Patient Education method: Explanation, Demonstration, Verbal cues, and Handouts Education comprehension: verbalized understanding, returned demonstration, and verbal cues required  HOME EXERCISE PROGRAM: Access Code: [facility] URL: [email or web address] Date: 11/22/2023 Prepared by: Lavinia Sharps  Exercises - Seated Assisted Cervical Rotation with Towel  - 1 x daily - 7 x weekly - 1 sets - 10 reps - Seated Thoracic Lumbar Extension  - 1 x daily - 7 x weekly - 1 sets - 10 reps - Seated Cervical Rotation AROM  - 1 x daily - 7 x weekly - 1 sets - 5 reps - Seated Cervical Sidebending AROM  - 1 x daily - 7 x weekly - 1 sets - 5 reps - Seated Isometric Cervical Sidebending  - 1 x daily - 7 x weekly - 1 sets - 5 reps - 5 hold - Seated Isometric Cervical Extension  - 1 x daily - 7 x weekly - 1 sets - 5 reps - 5 hold - Seated Isometric Cervical Flexion  - 1 x daily - 7 x weekly - 1 sets - 5 reps - 5 hold - Seated Isometric Cervical Rotation  - 1 x daily - 7 x weekly - 1 sets - 5 reps - 5 hold MD had given seated WITY, doorway stretch and serratus punches in supine, added cervical retraction    ASSESSMENT:  CLINICAL IMPRESSION: Bevely had a set back while gone on her trip to [place].  She is still dealing with a lot of emotional stress from moving from her home of 74 years.  She is missing  all of her friends and family.  Both daughters live locally but one daughter does not speak to her which is causing a lot of stress.  She  mentions a very prominent conflict over politics.  She states her only relief from pain and stress is her dancing.  She has this scheduled regularly and she was encouraged to continue this.  We discussed possibly discussing an anti-anxiety medication with her provider to help her get through the next few months as she gets accommodated to living here and dealing with the emotional toll that the relationship with her daughter is causing.  She agrees to do so.  She is responding well to dry needling but will likely continue to deal with physical issues if her stress level is not better controlled.     OBJECTIVE IMPAIRMENTS: decreased ROM, decreased strength, dizziness, hypomobility, increased fascial restrictions, increased muscle spasms, impaired flexibility, impaired UE functional use, postural dysfunction, and pain.   ACTIVITY LIMITATIONS: carrying, lifting, sleeping, transfers, bed mobility, reach over head, hygiene/grooming, and caring for others  PARTICIPATION LIMITATIONS: meal prep, cleaning, laundry, driving, shopping, community activity, yard work, and church  PERSONAL FACTORS: Age, Past/current experiences, and Time since onset of injury/illness/exacerbation are also affecting patient's functional outcome.   REHAB POTENTIAL: Good  CLINICAL DECISION MAKING: Stable/uncomplicated  EVALUATION COMPLEXITY: Low   GOALS: Goals reviewed with patient? Yes  SHORT TERM GOALS: Target date: 12/06/2023   Pain report to be no greater than 4/10  Baseline:  Goal status: In progress  2.  Patient will be independent with initial HEP  Baseline:  Goal status: In progress   LONG TERM GOALS: Target date: 01/03/2024   Patient to report pain no greater than 2/10  Baseline:  Goal status: INITIAL  2.  Patient to be independent with advanced HEP  Baseline:  Goal status: INITIAL  3.  Patient to be able to wake and rise with pain no greater than 2/10 Baseline:  Goal status: INITIAL  4.  C spine  ROM to improve by 5-8 degrees on rotation and side bending Baseline:  Goal status: INITIAL  5.  Patient to report 85% improvement in overall symptoms  Baseline:  Goal status: INITIAL  6.  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 Baseline:  Goal status: INITIAL   PLAN:  PT FREQUENCY: 2x/week  PT DURATION: 8 weeks  PLANNED INTERVENTIONS: 97110-Therapeutic exercises, 97530- Therapeutic activity, [id number]- Neuromuscular re-education, 97535- Self Care, 69629- Manual therapy, [phone]- Canalith repositioning, [id number]- Aquatic Therapy, 97014- Electrical stimulation (unattended), [id number]- Electrical stimulation (manual), [id number]- Vasopneumatic device, [id number]- Ultrasound, [id number]- Traction (mechanical), [id number]- Ionotophoresis 4mg /ml Dexamethasone, Patient/Family education, Balance training, Stair training, Taping, Dry Needling, Joint mobilization, Spinal mobilization, Vestibular training, Visual/preceptual remediation/compensation, Cryotherapy, and Moist heat  PLAN FOR NEXT SESSION: assess response to DN#4, continue postural strengthening, did not get to do foam roll MELT due to conversations about stress and managing her stress levels, would probably do well with resuming MELT along with the postural strengthening; cervical isometrics; cervical SNAG; no UBE "makes me nauseated"   Victorino Dike B. Lysbeth Dicola, PT 12/11/23 10:26 AM [facility] [address], [address] [place], [place] [address] Phone # [phone] Fax [phone]

## 2023-12-11 NOTE — Telephone Encounter (Signed)
 3rd attempt at calling patient back.  Call was unable to be completed.  Unable to reach patient. Please reach out to patient. Reason for Disposition . Third attempt to contact caller AND no contact made. Phone number verified.  Protocols used: No Contact or Duplicate Contact Call-A-AH

## 2023-12-11 NOTE — Telephone Encounter (Signed)
 Copied from CRM [id number]. Topic: Clinical - Medication Question >> Dec 11, 2023 11:37 AM Priscille Loveless wrote: Reason for CRM: Pt has been going to pt for her neck for 2 months and she went today and the therapist suggested that she call her PCP and suggest an anti anxiety medicine because her muscles are so tight that they are having trouble working with her. Please advise. Pt is very upset.   1st attempt at calling patient---Unable to reach patient or leave a voicemail. Attempted patient and patient's spouse's phone numbers.

## 2023-12-12 ENCOUNTER — Ambulatory Visit (INDEPENDENT_AMBULATORY_CARE_PROVIDER_SITE_OTHER): Payer: Medicare Other | Admitting: Family Medicine

## 2023-12-12 ENCOUNTER — Encounter: Payer: Self-pay | Admitting: Family Medicine

## 2023-12-12 VITALS — BP 108/74 | HR 73 | Wt 103.0 lb

## 2023-12-12 DIAGNOSIS — M62838 Other muscle spasm: Secondary | ICD-10-CM

## 2023-12-12 DIAGNOSIS — F419 Anxiety disorder, unspecified: Secondary | ICD-10-CM | POA: Diagnosis not present

## 2023-12-12 MED ORDER — METHOCARBAMOL 500 MG PO TABS: 500.0000 mg | ORAL_TABLET | Freq: Three times a day (TID) | ORAL | 0 refills | Status: DC | PRN

## 2023-12-12 NOTE — Progress Notes (Signed)
   Subjective:    Patient ID: Paige Suarez, female    DOB: 03-Feb-1951, 73 y.o.   MRN: [id number]  HPI She is here for consult concerning continued difficulty with neck pain and anxiety.  Presently she is getting physical therapy to help with that and they are using dry needling.  This does help temporarily.  She also has Robaxin but has not been using it with any frequency.  The notes from physical therapy were reviewed and did mention the stress that she is under dealing with her daughters.   Review of Systems     Objective:    Physical Exam Alert and in no distress otherwise not examined The notes from physical therapy and Dr. Lynelle Doctor were reviewed.      Assessment & Plan:  Anxiety - Plan: Ambulatory referral to Psychology  Muscle spasms of neck - heat/ice/stretches/strengthening exercises, cont chiro-ART discussed, TENS, topical meds - Plan: methocarbamol (ROBAXIN) 500 MG tablet I discussed stress and anxiety with her and its role in making the neck pain worse.  She will continue with physical therapy and with dry needling.  I did renew her Robaxin discussed the use of this and potential for sedation.  Will also refer her to [facility] to help her deal more positively with the stress that she is under and develop new ways to handle this.  She seemed receptive to this.

## 2023-12-13 ENCOUNTER — Ambulatory Visit: Payer: Medicare Other | Admitting: "Endocrinology

## 2023-12-14 ENCOUNTER — Ambulatory Visit: Payer: Self-pay | Admitting: Family Medicine

## 2023-12-14 ENCOUNTER — Ambulatory Visit: Payer: Medicare Other

## 2023-12-14 DIAGNOSIS — R293 Abnormal posture: Secondary | ICD-10-CM | POA: Diagnosis not present

## 2023-12-14 DIAGNOSIS — M6281 Muscle weakness (generalized): Secondary | ICD-10-CM

## 2023-12-14 DIAGNOSIS — M542 Cervicalgia: Secondary | ICD-10-CM

## 2023-12-14 DIAGNOSIS — R252 Cramp and spasm: Secondary | ICD-10-CM

## 2023-12-14 NOTE — Telephone Encounter (Signed)
Patient given recommendations. 

## 2023-12-14 NOTE — Telephone Encounter (Signed)
 I don't have any additional recommendations.  Bland diet, advise to avoid dairy. Imodium prn, as she has been doing. Will discuss further at visit, which is appropriate, since having symptoms for 5 weeks.

## 2023-12-14 NOTE — Therapy (Signed)
 OUTPATIENT PHYSICAL THERAPY CERVICAL TREATMENT   Patient Name: Paige Suarez MRN: [id number] DOB:[birth year], 73 y.o., female Today's Date: 12/14/2023  END OF SESSION:  PT End of Session - 12/14/23 1009     Visit Number 6    Date for PT Re-Evaluation 01/03/24    Authorization Type Medicare    Progress Note Due on Visit 10    PT Start Time 1009    PT Stop Time 1100    PT Time Calculation (min) 51 min    Activity Tolerance Patient tolerated treatment well    Behavior During Therapy [facility] for tasks assessed/performed             Past Medical History:  Diagnosis Date   Anxiety    Arthritis    Cataract [id number]   No surgery yet   Osteopenia    Osteoporosis    Past Surgical History:  Procedure Laterality Date   ESOPHAGOGASTRODUODENOSCOPY  09/27/2017   small hiatal hernia, reflux, esophagitis, erosive gastropathy   EYE SURGERY     Lasik   ovarectomy     benign   WRIST SURGERY Right    ligament tear, repaired   Patient Active Problem List   Diagnosis Date Noted   Degenerative disc disease, cervical 11/06/2023   Generalized anxiety disorder 05/10/2023   Age-related osteoporosis without current pathological fracture 02/20/2020   Anxiety 02/06/2017    PCP: Markus Daft  REFERRING PROVIDER: Judi Saa, DO  REFERRING DIAG: Joselyn Arrow, M   THERAPY DIAG:  Cervicalgia  Cramp and spasm  Muscle weakness (generalized)  Abnormal posture  Rationale for Evaluation and Treatment: Rehabilitation  ONSET DATE: 11/06/2023  SUBJECTIVE:                                                                                                                                                                                                         SUBJECTIVE STATEMENT: Patient reports she saw Dr. Susann Givens and will be referred to psych for her issues with anxiety.  She is pleased with this.  He prescribed muscle relaxer for her to see if this would reduce some of the spasm in her  shoulders and neck.  She states she forgot to mention the diarrhea and feels like she needs to talk to him about this.  She also mentions that she has had GI problems since she was a kid.  She had multiple GI series when she was young.  She states that she blames her mother's cooking.  She fried a lot of food and she just got to where  she would skip meals.  "I can go several days without eating"    EVAL: Patient states she just moved here.  She had to move to be near her daughters.  She lives with her spouse.  He is not very active but she goes dancing in [place].  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  Admits she is under a lot of stress from the move and being in a new area.  One daughter has stopped speaking to her and the other daughter is getting divorced.  She c/o neck pain with turning to look over her shoulders and upon waking in the morning.  She denies any sleep disturbance but does have the initial pain when she starts to rise in the morning.  She hopes to gain control of her neck pain and be able to enjoy dancing and being active.    Hand dominance: Right  PERTINENT HISTORY:  None  PAIN:  12/11/23 Are you having pain? Yes: NPRS scale: 7/10 Pain location: bilateral Pain description: tight, aching, sharp at times, headache pain Aggravating factors: stress Relieving factors: medication, ice, heat  PRECAUTIONS: None  RED FLAGS: None     WEIGHT BEARING RESTRICTIONS: No  FALLS:  Has patient fallen in last 6 months? No  LIVING ENVIRONMENT: Lives with: lives with their spouse Lives in: House/apartment   OCCUPATION: Retired  PLOF: Independent, Independent with basic ADLs, Independent with household mobility without device, Independent with community mobility without device, Independent with homemaking with ambulation, Independent with gait, and Independent with transfers  PATIENT GOALS:  She hopes to gain control of her neck pain and  be able to enjoy dancing and being active.  NEXT MD VISIT: prn  OBJECTIVE:  Note: Objective measures were completed at Evaluation unless otherwise noted.  DIAGNOSTIC FINDINGS:  na  PATIENT SURVEYS:  FOTO 69, predicted 80  COGNITION: Overall cognitive status: Within functional limits for tasks assessed  SENSATION: WFL  POSTURE: rounded shoulders and forward head  PALPATION: Extremely tight upper traps with multiple trigger points and taut bands.  Also in the splenius capitus and cervicis.  Tender at suboccipital area.    CERVICAL ROM:   Active ROM A/PROM (deg) eval  Flexion 50  Extension 35  Right lateral flexion 30  Left lateral flexion 30  Right rotation 40  Left rotation 30   (Blank rows = not tested)  UPPER EXTREMITY ROM:  WFL  UPPER EXTREMITY MMT:  All 5/5 with exception of bilateral shoulder ER 4-/5  CERVICAL SPECIAL TESTS:  Spurling's test: Negative  TREATMENT DATE:  12/14/23  Tband shoulder extension, rows, bilateral shoulder horizontal abduction and ER 2 x 10 3 way scapular stabilization with blue loop x 5 each side 4 D ball rolls attempted but patient had pain in right wrist (could only do left UE) Educated on need to stretch right wrist for extension ROM Supine C spine PROM and upper trap stretching Moist heat to C spine x 10 min  12/11/23  Tband shoulder extension, rows, bilateral shoulder horizontal abduction and ER 2 x 10 Cervical ROM assessed Discussed sleeping positions: patient is not using a pillow Discussed, again, her stress level currently and need to discuss possible anti-anxiety low dose to see if it will help her with her transition to moving to new place and her conflicts with her daughter.  Trigger Point Dry Needling Initial Treatment: Pt instructed on Dry Needling rational, procedures, and possible side  effects. Pt instructed to expect mild to moderate muscle soreness later in the day and/or into the next day.  Pt instructed in  methods to reduce muscle soreness. Pt instructed to continue prescribed HEP. Because Dry Needling was performed over or adjacent to a lung field, pt was educated on S/S of pneumothorax and to seek immediate medical attention should they occur.  Patient verbalized understanding of these instructions and education.  Patient Verbal Consent Given: Yes Education Handout Provided: Yes Muscles Treated: bil bilateral upper traps, cervical multifid marination and pistoning,  and sub occipitals pistoning Electrical Stimulation Performed: No Treatment Response/Outcome: improved soft tissue mobility, slight improvement in cervical rotation. Patient reported decrease in pain.    11/22/23  Review of current HEP with evidence of good compliance Foam roll melt method: cervical rotation, circles, nods 8-10x each Cervical isometrics  seated 5 sec hold 5x rotation, sidebending, flexion and extension (added to HEP) Manual therapy: soft tissue mobilization to bil cervical paraspinals, right upper trap and levator scap Trigger Point Dry Needling Initial Treatment: Pt instructed on Dry Needling rational, procedures, and possible side effects. Pt instructed to expect mild to moderate muscle soreness later in the day and/or into the next day.  Pt instructed in methods to reduce muscle soreness. Pt instructed to continue prescribed HEP. Because Dry Needling was performed over or adjacent to a lung field, pt was educated on S/S of pneumothorax and to seek immediate medical attention should they occur.  Patient verbalized understanding of these instructions and education.  Patient Verbal Consent Given: Yes Education Handout Provided: Yes Muscles Treated: right upper traps, right levator scap, right only cervical multifid marination  Electrical Stimulation Performed: No Treatment Response/Outcome: improved soft tissue mobility, slight improvement in cervical rotation   PATIENT EDUCATION:  11/20/23 Education details:  Educated on Fish farm manager, effect of stress on health, and avoiding self directing her health care.   Person educated: Patient Education method: Explanation, Demonstration, Verbal cues, and Handouts Education comprehension: verbalized understanding, returned demonstration, and verbal cues required  HOME EXERCISE PROGRAM: Access Code: [facility] URL: [email or web address] Date: 11/22/2023 Prepared by: Lavinia Sharps  Exercises - Seated Assisted Cervical Rotation with Towel  - 1 x daily - 7 x weekly - 1 sets - 10 reps - Seated Thoracic Lumbar Extension  - 1 x daily - 7 x weekly - 1 sets - 10 reps - Seated Cervical Rotation AROM  - 1 x daily - 7 x weekly - 1 sets - 5 reps - Seated Cervical Sidebending AROM  - 1 x daily - 7 x weekly - 1 sets - 5 reps - Seated Isometric Cervical Sidebending  - 1 x daily - 7 x weekly - 1 sets - 5 reps - 5 hold - Seated Isometric Cervical Extension  - 1 x daily - 7 x weekly - 1 sets - 5 reps - 5 hold - Seated Isometric Cervical Flexion  - 1 x daily - 7 x weekly - 1 sets - 5 reps - 5 hold - Seated Isometric Cervical Rotation  - 1 x daily - 7 x weekly - 1 sets - 5 reps - 5 hold MD had given seated WITY, doorway stretch and serratus punches in supine, added cervical retraction    ASSESSMENT:  CLINICAL IMPRESSION: Maritza did well today with new exercises with exception of ball rolls which she could not do on right due to wrist pain.  We added some wrist stretching today.  She seems to have a little relief with the  muscle relaxer and the dry needling that we had been doing.  We will continue to encourage her to seek attention for some of the medical issues that she is struggling with.  She would benefit from continued skilled PT to meet final goals.    OBJECTIVE IMPAIRMENTS: decreased ROM, decreased strength, dizziness, hypomobility, increased fascial restrictions, increased muscle spasms, impaired flexibility, impaired UE functional use, postural  dysfunction, and pain.   ACTIVITY LIMITATIONS: carrying, lifting, sleeping, transfers, bed mobility, reach over head, hygiene/grooming, and caring for others  PARTICIPATION LIMITATIONS: meal prep, cleaning, laundry, driving, shopping, community activity, yard work, and church  PERSONAL FACTORS: Age, Past/current experiences, and Time since onset of injury/illness/exacerbation are also affecting patient's functional outcome.   REHAB POTENTIAL: Good  CLINICAL DECISION MAKING: Stable/uncomplicated  EVALUATION COMPLEXITY: Low   GOALS: Goals reviewed with patient? Yes  SHORT TERM GOALS: Target date: 12/06/2023   Pain report to be no greater than 4/10  Baseline:  Goal status: In progress  2.  Patient will be independent with initial HEP  Baseline:  Goal status: In progress   LONG TERM GOALS: Target date: 01/03/2024   Patient to report pain no greater than 2/10  Baseline:  Goal status: INITIAL  2.  Patient to be independent with advanced HEP  Baseline:  Goal status: INITIAL  3.  Patient to be able to wake and rise with pain no greater than 2/10 Baseline:  Goal status: INITIAL  4.  C spine ROM to improve by 5-8 degrees on rotation and side bending Baseline:  Goal status: INITIAL  5.  Patient to report 85% improvement in overall symptoms  Baseline:  Goal status: INITIAL  6.  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 Baseline:  Goal status: INITIAL   PLAN:  PT FREQUENCY: 2x/week  PT DURATION: 8 weeks  PLANNED INTERVENTIONS: 97110-Therapeutic exercises, 97530- Therapeutic activity, [id number]- Neuromuscular re-education, 97535- Self Care, 96045- Manual therapy, [id number]- Canalith repositioning, [id number]- Aquatic Therapy, 97014- Electrical stimulation (unattended), [id number]- Electrical stimulation (manual), [id number]- Vasopneumatic device, [id number]- Ultrasound, [id number]- Traction (mechanical), [id number]- Ionotophoresis 4mg /ml Dexamethasone,  Patient/Family education, Balance training, Stair training, Taping, Dry Needling, Joint mobilization, Spinal mobilization, Vestibular training, Visual/preceptual remediation/compensation, Cryotherapy, and Moist heat  PLAN FOR NEXT SESSION: Continue postural strengthening, would probably do well with resuming MELT along with the postural strengthening; cervical isometrics; cervical SNAG; no UBE "makes me nauseated"   Victorino Dike B. Merrell Borsuk, PT 12/14/23 11:10 AM [facility] [address], [address] [place], [place] [address] Phone # [phone] Fax [phone]

## 2023-12-14 NOTE — Telephone Encounter (Signed)
 Copied from CRM [id number]. Topic: Clinical - Red Word Triage >> Dec 14, 2023 11:20 AM Gaetano Hawthorne wrote: Red Word that prompted transfer to Nurse Triage: Patient was seen by Dr Susann Givens on Wednesday - she forgot to mention that she has been experiencing Diarrhea for the past 5 weeks - Patient would like to know if she needs to make an appointment or what she can do.    Chief Complaint: Diarrhea Frequency: Ongoing for 5 weeks Pertinent Negatives: Patient denies nausea, vomiting Disposition: [] ED /[] Urgent Care (no appt availability in office) / [x] Appointment(In office/virtual)/ []   Virtual Care/ [] Home Care/ [] Refused Recommended Disposition /[]  Mobile Bus/ []  Follow-up with PCP Additional Notes: Patient was in the office recently, but forgot to mention that she has been having diarrhea for 5 weeks. She has tried taking Pepto Bismol and Imodium AD without improvement. On average, she has 1-2 episodes of diarrhea daily. Patient stated she has a trip coming soon on March 11, and she is hoping to get the issue resolved by then. Appointment scheduled on 3/3. Diet recommendations were given to patient such as avoidance of heavy, greasy food. Advised patient to eat small rather than large meals and follow a bland diet with items such as crackers, toast, rice. Patient asking if there are any further recommendations she could have prior to the appt because she is trying to resolve the problem quickly.   Reason for Disposition  Diarrhea is a chronic symptom (recurrent or ongoing AND present > 4 weeks)  Answer Assessment - Initial Assessment Questions 1. DIARRHEA SEVERITY: "How bad is the diarrhea?" "How many more stools have you had in the past 24 hours than normal?"    - NO DIARRHEA (SCALE 0)   - MILD (SCALE 1-3): Few loose or mushy BMs; increase of 1-3 stools over normal daily number of stools; mild increase in ostomy output.   -  MODERATE (SCALE 4-7): Increase of 4-6 stools daily over  normal; moderate increase in ostomy output.   -  SEVERE (SCALE 8-10; OR "WORST POSSIBLE"): Increase of 7 or more stools daily over normal; moderate increase in ostomy output; incontinence.     2-3 episodes a day  2. ONSET: "When did the diarrhea begin?"      5 weeks ago  3. BM CONSISTENCY: "How loose or watery is the diarrhea?"      Watery   4. VOMITING: "Are you also vomiting?" If Yes, ask: "How many times in the past 24 hours?"      No  5. ABDOMEN PAIN: "Are you having any abdomen pain?" If Yes, ask: "What does it feel like?" (e.g., crampy, dull, intermittent, constant)      No  6. ABDOMEN PAIN SEVERITY: If present, ask: "How bad is the pain?"  (e.g., Scale 1-10; mild, moderate, or severe)   - MILD (1-3): doesn't interfere with normal activities, abdomen soft and not tender to touch    - MODERATE (4-7): interferes with normal activities or awakens from sleep, abdomen tender to touch    - SEVERE (8-10): excruciating pain, doubled over, unable to do any normal activities       None  7. ORAL INTAKE: If vomiting, "Have you been able to drink liquids?" "How much liquids have you had in the past 24 hours?"     No vomiting  8. HYDRATION: "Any signs of dehydration?" (e.g., dry mouth [not just dry lips], too weak to stand, dizziness, new weight loss) "When did you last urinate?"  No  9. ANTIBIOTIC USE: "Are you taking antibiotics now or have you taken antibiotics in the past 2 months?"       No  10. OTHER SYMPTOMS: "Do you have any other symptoms?" (e.g., fever, blood in stool)       Vision was blurry yesterday (was hard to read a book) lasted a couple minutes, and felt better after drinking something with electrolytes. Vision is fine today  Protocols used: [facility]

## 2023-12-16 NOTE — Progress Notes (Unsigned)
 No chief complaint on file.  She spoke to the Triage nurse on 12/14/23: "she has been having diarrhea for 5 weeks. She has tried taking Pepto Bismol and Imodium AD without improvement. On average, she has 1-2 episodes of diarrhea daily. Patient stated she has a trip coming soon on March 11, and she is hoping to get the issue resolved by then. Appointment scheduled on 3/3. Diet recommendations were given to patient such as avoidance of heavy, greasy food. Advised patient to eat small rather than large meals and follow a bland diet with items such as crackers, toast, rice." She was advised from our office to avoid dairy, continue imodium as needed, and follow the bland diet as above.  She had told her PT on 12/14/23: "she has had GI problems since she was a kid. She had multiple GI series when she was young. She states that she blames her mother's cooking. She fried a lot of food and she just got to where she would skip meals." She is getting PT for neck pain.  She is under a lot of stress from the move and being in a new area. One daughter has stopped speaking to her (related to politics) and the other daughter is getting divorced.  She saw Dr. Susann Givens 2/26, and he refilled methocarbamol, and referred her to [facility] for counseling.  Dr. Terrilee Files had checked labs in January-- Normal c-met, iron studies. B12 levels high, normal vitamin D, TSH, PTH. Uric acid level was elevated at 7.5, and she as advised to start tart cherry 2400 mg at night.      PMH, PSH, SH reviewed   ROS:  Neck pain Stress/anxiety Diarrhea    PHYSICAL EXAM:  There were no vitals taken for this visit.  Wt Readings from Last 3 Encounters:  12/12/23 103 lb (46.7 kg)  11/06/23 104 lb (47.2 kg)  10/08/23 104 lb 12.8 oz (47.5 kg)       ASSESSMENT/PLAN:   Taking any Mg? Can cause diarrhea Did she start taking tart cherry as rec by Dr. Katrinka Blazing?  Could that be cause?  Vs IBS from stress  Consider b-met, Mg, cbc  (that was NOT done with other labs)

## 2023-12-17 ENCOUNTER — Encounter: Payer: Self-pay | Admitting: Family Medicine

## 2023-12-17 ENCOUNTER — Ambulatory Visit (INDEPENDENT_AMBULATORY_CARE_PROVIDER_SITE_OTHER): Payer: Medicare Other | Admitting: Family Medicine

## 2023-12-17 VITALS — BP 130/84 | HR 72 | Ht 61.0 in | Wt 103.8 lb

## 2023-12-17 DIAGNOSIS — F411 Generalized anxiety disorder: Secondary | ICD-10-CM | POA: Diagnosis not present

## 2023-12-17 DIAGNOSIS — R197 Diarrhea, unspecified: Secondary | ICD-10-CM

## 2023-12-17 DIAGNOSIS — M62838 Other muscle spasm: Secondary | ICD-10-CM | POA: Diagnosis not present

## 2023-12-17 NOTE — Patient Instructions (Signed)
 I suspect that your symptoms are related to irritable bowel syndrome (IBS). BUT, we should think about other causes and try to help with your symptoms. Getting the counseling and working on your stress might help. Try these measures as well--  Try lactose-free diet for 5-7 days. Continue a bland diet (rice, applesauce, toast), baked chicken.  Avoid any fried, greasy foods. You can take a fiber supplement such as Benefiber or Metamucil (powder, added to a full glass of water), once daily, in addition to trying to get fiber from fruits, vegetables and legumes in your diet. This can help bulk up loose stools.  Consider stopping the calcium pill just for a week or two to see if  your bowels improve. If they do, it is likely the magnesium that is contributing to the looser stool, and you can get a different calcium (without any Mg) to take instead.  I wouldn't use imodium unless you are having more frequent episodes of diarrhea.  If just 1-2 in the morning, no treatment is needed (other than the fiber). You don't need any extra electrolytes, unless you are having larger volume and more frequent episodes of diarrhea (pedialyte or gatorade are good).  You elected to hold off any bloodwork today. Let us know if you change your mind and we can have you back for bloodwork.   Try using the methocarbamol more regularly, as directed (up to 3 times/day if your neck muscles are very tight and painful).  We discussed dancing as an outlet (not just practicing). We also discussed volunteering (through church, [employer], etc). Check into the [place] for dancing.

## 2023-12-18 ENCOUNTER — Ambulatory Visit: Payer: Medicare Other | Attending: Family Medicine

## 2023-12-18 DIAGNOSIS — R293 Abnormal posture: Secondary | ICD-10-CM | POA: Diagnosis present

## 2023-12-18 DIAGNOSIS — M542 Cervicalgia: Secondary | ICD-10-CM | POA: Diagnosis present

## 2023-12-18 DIAGNOSIS — R252 Cramp and spasm: Secondary | ICD-10-CM | POA: Insufficient documentation

## 2023-12-18 DIAGNOSIS — R262 Difficulty in walking, not elsewhere classified: Secondary | ICD-10-CM | POA: Diagnosis present

## 2023-12-18 DIAGNOSIS — M6281 Muscle weakness (generalized): Secondary | ICD-10-CM | POA: Insufficient documentation

## 2023-12-18 NOTE — Therapy (Signed)
 OUTPATIENT PHYSICAL THERAPY CERVICAL TREATMENT   Patient Name: Paige Suarez MRN: [id number] DOB:1950/12/07, 73 y.o., female Today's Date: 12/18/2023  END OF SESSION:  PT End of Session - 12/18/23 0941     Visit Number 7    Date for PT Re-Evaluation 01/03/24    Authorization Type Medicare    Progress Note Due on Visit 10    PT Start Time 0934    PT Stop Time 1015    PT Time Calculation (min) 41 min    Activity Tolerance Patient tolerated treatment well    Behavior During Therapy [facility] for tasks assessed/performed             Past Medical History:  Diagnosis Date   Anxiety    Arthritis    Cataract [id number]   No surgery yet   Osteopenia    Osteoporosis    Past Surgical History:  Procedure Laterality Date   ESOPHAGOGASTRODUODENOSCOPY  09/27/2017   small hiatal hernia, reflux, esophagitis, erosive gastropathy   EYE SURGERY     Lasik   ovarectomy     benign   WRIST SURGERY Right    ligament tear, repaired   Patient Active Problem List   Diagnosis Date Noted   Degenerative disc disease, cervical 11/06/2023   Generalized anxiety disorder 05/10/2023   Age-related osteoporosis without current pathological fracture 02/20/2020   Anxiety 02/06/2017    PCP: Markus Daft  REFERRING PROVIDER: Judi Saa, DO  REFERRING DIAG: Joselyn Arrow, M   THERAPY DIAG:  Cervicalgia  Cramp and spasm  Muscle weakness (generalized)  Abnormal posture  Difficulty in walking, not elsewhere classified  Rationale for Evaluation and Treatment: Rehabilitation  ONSET DATE: 11/06/2023  SUBJECTIVE:                                                                                                                                                                                                         SUBJECTIVE STATEMENT: Patient reports she is doing ok.  I am taking the muscle relaxer more consistently per MD suggestion.  I am going to be meeting with psych for the anxiety.     EVAL: Patient states she just moved here.  She had to move to be near her daughters.  She lives with her spouse.  He is not very active but she goes dancing in [place].  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  Admits she is under a lot of stress from the move and being in a new area.  One daughter has stopped speaking to her and the other daughter is getting divorced.  She c/o neck pain with turning to look over her shoulders and upon waking in the morning.  She denies any sleep disturbance but does have the initial pain when she starts to rise in the morning.  She hopes to gain control of her neck pain and be able to enjoy dancing and being active.    Hand dominance: Right  PERTINENT HISTORY:  None  PAIN:  12/18/23 Are you having pain? Yes: NPRS scale: 5-6/10 Pain location: bilateral Pain description: tight, aching, sharp at times, headache pain Aggravating factors: stress Relieving factors: medication, ice, heat  PRECAUTIONS: None  RED FLAGS: None     WEIGHT BEARING RESTRICTIONS: No  FALLS:  Has patient fallen in last 6 months? No  LIVING ENVIRONMENT: Lives with: lives with their spouse Lives in: House/apartment   OCCUPATION: Retired  PLOF: Independent, Independent with basic ADLs, Independent with household mobility without device, Independent with community mobility without device, Independent with homemaking with ambulation, Independent with gait, and Independent with transfers  PATIENT GOALS:  She hopes to gain control of her neck pain and be able to enjoy dancing and being active.  NEXT MD VISIT: prn  OBJECTIVE:  Note: Objective measures were completed at Evaluation unless otherwise noted.  DIAGNOSTIC FINDINGS:  na  PATIENT SURVEYS:  FOTO 53, predicted 35  COGNITION: Overall cognitive status: Within functional limits for tasks assessed  SENSATION: WFL  POSTURE: rounded shoulders and forward  head  PALPATION: Extremely tight upper traps with multiple trigger points and taut bands.  Also in the splenius capitus and cervicis.  Tender at suboccipital area.    CERVICAL ROM:   Active ROM A/PROM (deg) eval  Flexion 50  Extension 35  Right lateral flexion 30  Left lateral flexion 30  Right rotation 40  Left rotation 30   (Blank rows = not tested)  UPPER EXTREMITY ROM:  WFL  UPPER EXTREMITY MMT:  All 5/5 with exception of bilateral shoulder ER 4-/5  CERVICAL SPECIAL TESTS:  Spurling's test: Negative  TREATMENT DATE:  12/18/23  3 way scapular stabilization with blue loop x 5 each side 4 D ball rolls attempted but patient had pain in right wrist (could only do left UE) Wrist extension/flexion ROM both 5 x holding 10 sec each Lat pull down 2 x 10 with 25 Lbs Matrix standing row 2 x 10 with 10 lbs Tband (red) 2 x 10 shoulder bilateral ER Tband (red) 2 x 10 shoulder bilateral shoulder horizontal abduction  Lower trap lift off Trigger Point Dry Needling Initial Treatment: Pt instructed on Dry Needling rational, procedures, and possible side effects. Pt instructed to expect mild to moderate muscle soreness later in the day and/or into the next day.  Pt instructed in methods to reduce muscle soreness. Pt instructed to continue prescribed HEP. Because Dry Needling was performed over or adjacent to a lung field, pt was educated on S/S of pneumothorax and to seek immediate medical attention should they occur.  Patient verbalized understanding of these instructions and education.  Patient Verbal Consent Given: Yes Education Handout Provided: Yes Muscles Treated: bil bilateral upper traps, cervical multifid marination and pistoning,  and sub occipitals pistoning Electrical Stimulation Performed: No Treatment Response/Outcome: improved soft tissue mobility, slight improvement in cervical rotation. Patient reported decrease in pain.    12/14/23  Tband shoulder extension, rows,  bilateral shoulder horizontal abduction and ER 2 x 10 3 way scapular stabilization  with blue loop x 5 each side 4 D ball rolls attempted but patient had pain in right wrist (could only do left UE) Educated on need to stretch right wrist for extension ROM Supine C spine PROM and upper trap stretching Moist heat to C spine x 10 min  12/11/23  Tband shoulder extension, rows, bilateral shoulder horizontal abduction and ER 2 x 10 Cervical ROM assessed Discussed sleeping positions: patient is not using a pillow Discussed, again, her stress level currently and need to discuss possible anti-anxiety low dose to see if it will help her with her transition to moving to new place and her conflicts with her daughter.  Trigger Point Dry Needling Initial Treatment: Pt instructed on Dry Needling rational, procedures, and possible side effects. Pt instructed to expect mild to moderate muscle soreness later in the day and/or into the next day.  Pt instructed in methods to reduce muscle soreness. Pt instructed to continue prescribed HEP. Because Dry Needling was performed over or adjacent to a lung field, pt was educated on S/S of pneumothorax and to seek immediate medical attention should they occur.  Patient verbalized understanding of these instructions and education.  Patient Verbal Consent Given: Yes Education Handout Provided: Yes Muscles Treated: bil bilateral upper traps, cervical multifid marination and pistoning,  and sub occipitals pistoning Electrical Stimulation Performed: No Treatment Response/Outcome: improved soft tissue mobility, slight improvement in cervical rotation. Patient reported decrease in pain.     PATIENT EDUCATION:  11/20/23 Education details: Educated on Fish farm manager, effect of stress on health, and avoiding self directing her health care.   Person educated: Patient Education method: Explanation, Demonstration, Verbal cues, and Handouts Education comprehension: verbalized  understanding, returned demonstration, and verbal cues required  HOME EXERCISE PROGRAM: Access Code: [facility] URL: [email or web address] Date: 11/22/2023 Prepared by: Lavinia Sharps  Exercises - Seated Assisted Cervical Rotation with Towel  - 1 x daily - 7 x weekly - 1 sets - 10 reps - Seated Thoracic Lumbar Extension  - 1 x daily - 7 x weekly - 1 sets - 10 reps - Seated Cervical Rotation AROM  - 1 x daily - 7 x weekly - 1 sets - 5 reps - Seated Cervical Sidebending AROM  - 1 x daily - 7 x weekly - 1 sets - 5 reps - Seated Isometric Cervical Sidebending  - 1 x daily - 7 x weekly - 1 sets - 5 reps - 5 hold - Seated Isometric Cervical Extension  - 1 x daily - 7 x weekly - 1 sets - 5 reps - 5 hold - Seated Isometric Cervical Flexion  - 1 x daily - 7 x weekly - 1 sets - 5 reps - 5 hold - Seated Isometric Cervical Rotation  - 1 x daily - 7 x weekly - 1 sets - 5 reps - 5 hold MD had given seated WITY, doorway stretch and serratus punches in supine, added cervical retraction    ASSESSMENT:  CLINICAL IMPRESSION: Caila is progressing slowly but with steady positive changes.  She seems to be doing well since beginning muscle relaxer.  She has good relief with dry needling. We will work on progressing resistive exercises and add in prone shoulder series.  She would benefit from continued skilled PT to meet final goals.    OBJECTIVE IMPAIRMENTS: decreased ROM, decreased strength, dizziness, hypomobility, increased fascial restrictions, increased muscle spasms, impaired flexibility, impaired UE functional use, postural dysfunction, and pain.   ACTIVITY LIMITATIONS: carrying, lifting, sleeping, transfers, bed mobility,  reach over head, hygiene/grooming, and caring for others  PARTICIPATION LIMITATIONS: meal prep, cleaning, laundry, driving, shopping, community activity, yard work, and church  PERSONAL FACTORS: Age, Past/current experiences, and Time since onset of  injury/illness/exacerbation are also affecting patient's functional outcome.   REHAB POTENTIAL: Good  CLINICAL DECISION MAKING: Stable/uncomplicated  EVALUATION COMPLEXITY: Low   GOALS: Goals reviewed with patient? Yes  SHORT TERM GOALS: Target date: 12/06/2023   Pain report to be no greater than 4/10  Baseline:  Goal status: In progress  2.  Patient will be independent with initial HEP  Baseline:  Goal status: MET 12/18/23   LONG TERM GOALS: Target date: 01/03/2024   Patient to report pain no greater than 2/10  Baseline:  Goal status: INITIAL  2.  Patient to be independent with advanced HEP  Baseline:  Goal status: INITIAL  3.  Patient to be able to wake and rise with pain no greater than 2/10 Baseline:  Goal status: INITIAL  4.  C spine ROM to improve by 5-8 degrees on rotation and side bending Baseline:  Goal status: INITIAL  5.  Patient to report 85% improvement in overall symptoms  Baseline:  Goal status: INITIAL  6.  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 Baseline:  Goal status: INITIAL   PLAN:  PT FREQUENCY: 2x/week  PT DURATION: 8 weeks  PLANNED INTERVENTIONS: 97110-Therapeutic exercises, 97530- Therapeutic activity, 97112- Neuromuscular re-education, 97535- Self Care, 98119- Manual therapy, [id number]- Canalith repositioning, [id number]- Aquatic Therapy, 97014- Electrical stimulation (unattended), [id number]- Electrical stimulation (manual), [id number]- Vasopneumatic device, [id number]- Ultrasound, [id number]- Traction (mechanical), [id number]- Ionotophoresis 4mg /ml Dexamethasone, Patient/Family education, Balance training, Stair training, Taping, Dry Needling, Joint mobilization, Spinal mobilization, Vestibular training, Visual/preceptual remediation/compensation, Cryotherapy, and Moist heat  PLAN FOR NEXT SESSION: Continue postural strengthening,  MELT ; cervical isometrics; cervical SNAG; no UBE "makes me nauseated"   Rheba Diamond B.  Laquitha Heslin, PT 12/18/23 8:04 PM [facility] [address] [place], [place] [address] Phone # [phone] Fax [phone]

## 2023-12-20 ENCOUNTER — Ambulatory Visit: Payer: Medicare Other

## 2023-12-20 DIAGNOSIS — M542 Cervicalgia: Secondary | ICD-10-CM | POA: Diagnosis not present

## 2023-12-20 DIAGNOSIS — R293 Abnormal posture: Secondary | ICD-10-CM

## 2023-12-20 DIAGNOSIS — R262 Difficulty in walking, not elsewhere classified: Secondary | ICD-10-CM

## 2023-12-20 DIAGNOSIS — M6281 Muscle weakness (generalized): Secondary | ICD-10-CM

## 2023-12-20 DIAGNOSIS — R252 Cramp and spasm: Secondary | ICD-10-CM

## 2023-12-20 NOTE — Therapy (Signed)
 OUTPATIENT PHYSICAL THERAPY CERVICAL TREATMENT   Patient Name: Paige Suarez MRN: [id number] DOB:1951-02-21, 73 y.o., female Today's Date: 12/20/2023  END OF SESSION:  PT End of Session - 12/20/23 1234     Visit Number 8    Date for PT Re-Evaluation 01/03/24    Authorization Type Medicare    Progress Note Due on Visit 10    PT Start Time 1234    PT Stop Time 1314    PT Time Calculation (min) 40 min    Activity Tolerance Patient tolerated treatment well    Behavior During Therapy [facility] for tasks assessed/performed             Past Medical History:  Diagnosis Date   Anxiety    Arthritis    Cataract 072024   No surgery yet   Osteopenia    Osteoporosis    Past Surgical History:  Procedure Laterality Date   ESOPHAGOGASTRODUODENOSCOPY  09/27/2017   small hiatal hernia, reflux, esophagitis, erosive gastropathy   EYE SURGERY     Lasik   ovarectomy     benign   WRIST SURGERY Right    ligament tear, repaired   Patient Active Problem List   Diagnosis Date Noted   Degenerative disc disease, cervical 11/06/2023   Generalized anxiety disorder 05/10/2023   Age-related osteoporosis without current pathological fracture 02/20/2020   Anxiety 02/06/2017    PCP: Markus Daft  REFERRING PROVIDER: Judi Saa, DO  REFERRING DIAG: Joselyn Arrow, M   THERAPY DIAG:  Cervicalgia  Cramp and spasm  Muscle weakness (generalized)  Abnormal posture  Difficulty in walking, not elsewhere classified  Rationale for Evaluation and Treatment: Rehabilitation  ONSET DATE: 11/06/2023  SUBJECTIVE:                                                                                                                                                                                                         SUBJECTIVE STATEMENT: Patient reports she is doing "better"  EVAL: Patient states she just moved here.  She had to move to be near her daughters.  She lives with her spouse.  He is not  very active but she goes dancing in [place].  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  Admits she is under a lot of stress from the move and being in a new area.  One daughter has stopped speaking to her and the other daughter is getting divorced.  She c/o neck pain with turning to look over her  shoulders and upon waking in the morning.  She denies any sleep disturbance but does have the initial pain when she starts to rise in the morning.  She hopes to gain control of her neck pain and be able to enjoy dancing and being active.    Hand dominance: Right  PERTINENT HISTORY:  None  PAIN:  12/18/23 Are you having pain? Yes: NPRS scale: 5-6/10 Pain location: bilateral Pain description: tight, aching, sharp at times, headache pain Aggravating factors: stress Relieving factors: medication, ice, heat  PRECAUTIONS: None  RED FLAGS: None     WEIGHT BEARING RESTRICTIONS: No  FALLS:  Has patient fallen in last 6 months? No  LIVING ENVIRONMENT: Lives with: lives with their spouse Lives in: House/apartment   OCCUPATION: Retired  PLOF: Independent, Independent with basic ADLs, Independent with household mobility without device, Independent with community mobility without device, Independent with homemaking with ambulation, Independent with gait, and Independent with transfers  PATIENT GOALS:  She hopes to gain control of her neck pain and be able to enjoy dancing and being active.  NEXT MD VISIT: prn  OBJECTIVE:  Note: Objective measures were completed at Evaluation unless otherwise noted.  DIAGNOSTIC FINDINGS:  na  PATIENT SURVEYS:  FOTO 21, predicted 53  COGNITION: Overall cognitive status: Within functional limits for tasks assessed  SENSATION: WFL  POSTURE: rounded shoulders and forward head  PALPATION: Extremely tight upper traps with multiple trigger points and taut bands.  Also in the splenius capitus and cervicis.  Tender at  suboccipital area.    CERVICAL ROM:   Active ROM A/PROM (deg) eval  Flexion 50  Extension 35  Right lateral flexion 30  Left lateral flexion 30  Right rotation 40  Left rotation 30   (Blank rows = not tested)  UPPER EXTREMITY ROM:  WFL  UPPER EXTREMITY MMT:  All 5/5 with exception of bilateral shoulder ER 4-/5  CERVICAL SPECIAL TESTS:  Spurling's test: Negative  TREATMENT DATE:  12/20/23  Standing shoulder flexion and scaption with 2 lbs 2 x 10 Fwd bent shoulder rows and horizontal abduction 2 x 10 with 2 lbs 3 way scapular stabilization with blue loop x 5 each side 4 D ball rolls attempted but patient had pain in right wrist (could only do left UE) Push up plus on wall 2 x10 Wrist extension/flexion ROM both 5 x holding 10 sec each Lat pull down 2 x 10 with 25 Lbs Matrix standing row 2 x 10 with 10 lbs Tband (red) 2 x 10 shoulder bilateral ER Tband (red) 2 x 10 shoulder bilateral shoulder horizontal abduction  Lower trap lift off 2 x 10  12/18/23  3 way scapular stabilization with blue loop x 5 each side 4 D ball rolls attempted but patient had pain in right wrist (could only do left UE) Wrist extension/flexion ROM both 5 x holding 10 sec each Lat pull down 2 x 10 with 25 Lbs Matrix standing row 2 x 10 with 10 lbs Tband (red) 2 x 10 shoulder bilateral ER Tband (red) 2 x 10 shoulder bilateral shoulder horizontal abduction  Lower trap lift off Trigger Point Dry Needling Initial Treatment: Pt instructed on Dry Needling rational, procedures, and possible side effects. Pt instructed to expect mild to moderate muscle soreness later in the day and/or into the next day.  Pt instructed in methods to reduce muscle soreness. Pt instructed to continue prescribed HEP. Because Dry Needling was performed over or adjacent to a lung field, pt was educated  on S/S of pneumothorax and to seek immediate medical attention should they occur.  Patient verbalized understanding of these  instructions and education.  Patient Verbal Consent Given: Yes Education Handout Provided: Yes Muscles Treated: bil bilateral upper traps, cervical multifid marination and pistoning,  and sub occipitals pistoning Electrical Stimulation Performed: No Treatment Response/Outcome: improved soft tissue mobility, slight improvement in cervical rotation. Patient reported decrease in pain.    12/14/23  Tband shoulder extension, rows, bilateral shoulder horizontal abduction and ER 2 x 10 3 way scapular stabilization with blue loop x 5 each side 4 D ball rolls attempted but patient had pain in right wrist (could only do left UE) Educated on need to stretch right wrist for extension ROM Supine C spine PROM and upper trap stretching Moist heat to C spine x 10 min  PATIENT EDUCATION:  11/20/23 Education details: Educated on Fish farm manager, effect of stress on health, and avoiding self directing her health care.   Person educated: Patient Education method: Explanation, Demonstration, Verbal cues, and Handouts Education comprehension: verbalized understanding, returned demonstration, and verbal cues required  HOME EXERCISE PROGRAM: Access Code: [facility] URL: [email or web address] Date: 11/22/2023 Prepared by: Lavinia Sharps  Exercises - Seated Assisted Cervical Rotation with Towel  - 1 x daily - 7 x weekly - 1 sets - 10 reps - Seated Thoracic Lumbar Extension  - 1 x daily - 7 x weekly - 1 sets - 10 reps - Seated Cervical Rotation AROM  - 1 x daily - 7 x weekly - 1 sets - 5 reps - Seated Cervical Sidebending AROM  - 1 x daily - 7 x weekly - 1 sets - 5 reps - Seated Isometric Cervical Sidebending  - 1 x daily - 7 x weekly - 1 sets - 5 reps - 5 hold - Seated Isometric Cervical Extension  - 1 x daily - 7 x weekly - 1 sets - 5 reps - 5 hold - Seated Isometric Cervical Flexion  - 1 x daily - 7 x weekly - 1 sets - 5 reps - 5 hold - Seated Isometric Cervical Rotation  - 1 x daily - 7 x  weekly - 1 sets - 5 reps - 5 hold MD had given seated WITY, doorway stretch and serratus punches in supine, added cervical retraction    ASSESSMENT:  CLINICAL IMPRESSION: Magaret was able to tolerate increased resistance on several exercises and we added more specific shoulder exercises.  She is reporting better symptom control.  She understands that she continues to need eval by psych due some of her anxiousness and stress levels.  Overall, she is doing well.   She would benefit from continued skilled PT to meet final goals.    OBJECTIVE IMPAIRMENTS: decreased ROM, decreased strength, dizziness, hypomobility, increased fascial restrictions, increased muscle spasms, impaired flexibility, impaired UE functional use, postural dysfunction, and pain.   ACTIVITY LIMITATIONS: carrying, lifting, sleeping, transfers, bed mobility, reach over head, hygiene/grooming, and caring for others  PARTICIPATION LIMITATIONS: meal prep, cleaning, laundry, driving, shopping, community activity, yard work, and church  PERSONAL FACTORS: Age, Past/current experiences, and Time since onset of injury/illness/exacerbation are also affecting patient's functional outcome.   REHAB POTENTIAL: Good  CLINICAL DECISION MAKING: Stable/uncomplicated  EVALUATION COMPLEXITY: Low   GOALS: Goals reviewed with patient? Yes  SHORT TERM GOALS: Target date: 12/06/2023   Pain report to be no greater than 4/10  Baseline:  Goal status: In progress  2.  Patient will be independent with initial HEP  Baseline:  Goal status: MET 12/18/23   LONG TERM GOALS: Target date: 01/03/2024   Patient to report pain no greater than 2/10  Baseline:  Goal status: INITIAL  2.  Patient to be independent with advanced HEP  Baseline:  Goal status: INITIAL  3.  Patient to be able to wake and rise with pain no greater than 2/10 Baseline:  Goal status: INITIAL  4.  C spine ROM to improve by 5-8 degrees on rotation and side  bending Baseline:  Goal status: INITIAL  5.  Patient to report 85% improvement in overall symptoms  Baseline:  Goal status: INITIAL  6.  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 Baseline:  Goal status: INITIAL   PLAN:  PT FREQUENCY: 2x/week  PT DURATION: 8 weeks  PLANNED INTERVENTIONS: 97110-Therapeutic exercises, 97530- Therapeutic activity, [id number]- Neuromuscular re-education, 97535- Self Care, [id number]- Manual therapy, [id number]- Canalith repositioning, [id number]- Aquatic Therapy, 97014- Electrical stimulation (unattended), [id number]- Electrical stimulation (manual), [id number]- Vasopneumatic device, [id number]- Ultrasound, [id number]- Traction (mechanical), [id number]- Ionotophoresis 4mg /ml Dexamethasone, Patient/Family education, Balance training, Stair training, Taping, Dry Needling, Joint mobilization, Spinal mobilization, Vestibular training, Visual/preceptual remediation/compensation, Cryotherapy, and Moist heat  PLAN FOR NEXT SESSION: Continue postural strengthening,  dry needling as needed,  cervical isometrics; cervical SNAG; no UBE "makes me nauseated"   Rolando Whitby B. Shantoya Geurts, PT 12/20/23 4:48 PM [facility] [address], [address] [place], [place] [address] Phone # [phone] Fax [phone]

## 2024-01-03 ENCOUNTER — Ambulatory Visit: Admitting: Licensed Clinical Social Worker

## 2024-01-03 ENCOUNTER — Ambulatory Visit: Payer: Medicare Other

## 2024-01-03 DIAGNOSIS — R293 Abnormal posture: Secondary | ICD-10-CM

## 2024-01-03 DIAGNOSIS — R252 Cramp and spasm: Secondary | ICD-10-CM

## 2024-01-03 DIAGNOSIS — M542 Cervicalgia: Secondary | ICD-10-CM | POA: Diagnosis not present

## 2024-01-03 DIAGNOSIS — M6281 Muscle weakness (generalized): Secondary | ICD-10-CM

## 2024-01-03 DIAGNOSIS — F4321 Adjustment disorder with depressed mood: Secondary | ICD-10-CM | POA: Diagnosis not present
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
## 2024-01-03 NOTE — Therapy (Signed)
 OUTPATIENT PHYSICAL THERAPY CERVICAL TREATMENT PHYSICAL THERAPY DISCHARGE SUMMARY  Visits from Start of Care: 9  Current functional level related to goals / functional outcomes: See below   Remaining deficits: See below   Education / Equipment:  See below   Patient agrees to discharge. Patient goals were partially met. Patient is being discharged due to maximized rehab potential.     Patient Name: Paige Suarez MRN: [id number] DOB:12-18-1950, 73 y.o., female Today's Date: 01/03/2024  END OF SESSION:  PT End of Session - 01/03/24 1236     Visit Number 9    Date for PT Re-Evaluation 01/03/24    Authorization Type Medicare    Progress Note Due on Visit 10    PT Start Time 1230    PT Stop Time 1304    PT Time Calculation (min) 34 min    Activity Tolerance Patient tolerated treatment well    Behavior During Therapy [facility] for tasks assessed/performed             Past Medical History:  Diagnosis Date   Anxiety    Arthritis    Cataract 072024   No surgery yet   Osteopenia    Osteoporosis    Past Surgical History:  Procedure Laterality Date   ESOPHAGOGASTRODUODENOSCOPY  09/27/2017   small hiatal hernia, reflux, esophagitis, erosive gastropathy   EYE SURGERY     Lasik   ovarectomy     benign   WRIST SURGERY Right    ligament tear, repaired   Patient Active Problem List   Diagnosis Date Noted   Degenerative disc disease, cervical 11/06/2023   Generalized anxiety disorder 05/10/2023   Age-related osteoporosis without current pathological fracture 02/20/2020   Anxiety 02/06/2017    PCP: Markus Daft  REFERRING PROVIDER: Judi Saa, DO  REFERRING DIAG: Joselyn Arrow, M   THERAPY DIAG:  Abnormal posture  Cervicalgia  Cramp and spasm  Muscle weakness (generalized)  Rationale for Evaluation and Treatment: Rehabilitation  ONSET DATE: 11/06/2023  SUBJECTIVE:                                                                                                                                                                                                          SUBJECTIVE STATEMENT: Patient reports she is doing "better"  EVAL: Patient states she just moved here.  She had to move to be near her daughters.  She lives with her spouse.  He is not very active but she goes dancing in [place].  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  Admits she is under a lot of stress from the move and being in a new area.  One daughter has stopped speaking to her and the other daughter is getting divorced.  She c/o neck pain with turning to look over her shoulders and upon waking in the morning.  She denies any sleep disturbance but does have the initial pain when she starts to rise in the morning.  She hopes to gain control of her neck pain and be able to enjoy dancing and being active.    Hand dominance: Right  PERTINENT HISTORY:  None  PAIN:  01/03/24 Are you having pain? Yes: NPRS scale: 7/10 Pain location: bilateral Pain description: tight, aching, sharp at times, headache pain Aggravating factors: stress Relieving factors: medication, ice, heat  PRECAUTIONS: None  RED FLAGS: None     WEIGHT BEARING RESTRICTIONS: No  FALLS:  Has patient fallen in last 6 months? No  LIVING ENVIRONMENT: Lives with: lives with their spouse Lives in: House/apartment   OCCUPATION: Retired  PLOF: Independent, Independent with basic ADLs, Independent with household mobility without device, Independent with community mobility without device, Independent with homemaking with ambulation, Independent with gait, and Independent with transfers  PATIENT GOALS:  She hopes to gain control of her neck pain and be able to enjoy dancing and being active.  NEXT MD VISIT: prn  OBJECTIVE:  Note: Objective measures were completed at Evaluation unless otherwise noted.  DIAGNOSTIC FINDINGS:  na  PATIENT SURVEYS:  FOTO 38,  predicted 82  COGNITION: Overall cognitive status: Within functional limits for tasks assessed  SENSATION: WFL  POSTURE: rounded shoulders and forward head  PALPATION: Extremely tight upper traps with multiple trigger points and taut bands.  Also in the splenius capitus and cervicis.  Tender at suboccipital area.    CERVICAL ROM:   Active ROM A/PROM (deg) eval 01/03/24  Flexion 50 50  Extension 35 35  Right lateral flexion 30 35  Left lateral flexion 30 35  Right rotation 40 50  Left rotation 30 50   (Blank rows = not tested)  UPPER EXTREMITY ROM:  WFL  UPPER EXTREMITY MMT:  All 5/5 with exception of bilateral shoulder ER 4-/5 12/2023: All 5/5 with exception of right shoulder ER 4/5  CERVICAL SPECIAL TESTS:  Spurling's test: Negative  TREATMENT DATE:  01/03/24 DC reassessment completed Lengthy discussion about patients emotional journey since moving here and how her physical symptoms will not likely improve drastically with the amount of stress she is dealing with.  Patient opened up about a lifetime of emotional issues from childhood that likely carried through to how she raised her kids.  She has appt with counselor at 3 pm today at our recommendation to her PCP.   Reviewed HEP thoroughly and updated.  Printouts provided reflecting changes made.    12/20/23  Standing shoulder flexion and scaption with 2 lbs 2 x 10 Fwd bent shoulder rows and horizontal abduction 2 x 10 with 2 lbs 3 way scapular stabilization with blue loop x 5 each side 4 D ball rolls attempted but patient had pain in right wrist (could only do left UE) Push up plus on wall 2 x10 Wrist extension/flexion ROM both 5 x holding 10 sec each Lat pull down 2 x 10 with 25 Lbs Matrix standing row 2 x 10 with 10 lbs Tband (red) 2 x 10 shoulder bilateral ER Tband (red) 2 x 10 shoulder bilateral shoulder horizontal abduction  Lower trap lift off 2 x 10  12/18/23  3 way scapular stabilization with blue loop x 5  each side 4 D ball rolls attempted but patient had pain in right wrist (could only do left UE) Wrist extension/flexion ROM both 5 x holding 10 sec each Lat pull down 2 x 10 with 25 Lbs Matrix standing row 2 x 10 with 10 lbs Tband (red) 2 x 10 shoulder bilateral ER Tband (red) 2 x 10 shoulder bilateral shoulder horizontal abduction  Lower trap lift off Trigger Point Dry Needling Initial Treatment: Pt instructed on Dry Needling rational, procedures, and possible side effects. Pt instructed to expect mild to moderate muscle soreness later in the day and/or into the next day.  Pt instructed in methods to reduce muscle soreness. Pt instructed to continue prescribed HEP. Because Dry Needling was performed over or adjacent to a lung field, pt was educated on S/S of pneumothorax and to seek immediate medical attention should they occur.  Patient verbalized understanding of these instructions and education.  Patient Verbal Consent Given: Yes Education Handout Provided: Yes Muscles Treated: bil bilateral upper traps, cervical multifid marination and pistoning,  and sub occipitals pistoning Electrical Stimulation Performed: No Treatment Response/Outcome: improved soft tissue mobility, slight improvement in cervical rotation. Patient reported decrease in pain.    12/14/23  Tband shoulder extension, rows, bilateral shoulder horizontal abduction and ER 2 x 10 3 way scapular stabilization with blue loop x 5 each side 4 D ball rolls attempted but patient had pain in right wrist (could only do left UE) Educated on need to stretch right wrist for extension ROM Supine C spine PROM and upper trap stretching Moist heat to C spine x 10 min  PATIENT EDUCATION:  11/20/23 Education details: Educated on Fish farm manager, effect of stress on health, and avoiding self directing her health care.   Person educated: Patient Education method: Explanation, Demonstration, Verbal cues, and Handouts Education  comprehension: verbalized understanding, returned demonstration, and verbal cues required  HOME EXERCISE PROGRAM: Access Code: [facility] URL: [email or web address] Date: 01/03/2024 Prepared by: Mikey Kirschner  Exercises - Seated Assisted Cervical Rotation with Towel  - 1 x daily - 7 x weekly - 1 sets - 10 reps - Seated Thoracic Lumbar Extension  - 1 x daily - 7 x weekly - 1 sets - 10 reps - Seated Cervical Rotation AROM  - 1 x daily - 7 x weekly - 1 sets - 5 reps - Seated Cervical Sidebending AROM  - 1 x daily - 7 x weekly - 1 sets - 5 reps - Seated Isometric Cervical Sidebending  - 1 x daily - 7 x weekly - 1 sets - 5 reps - 5 hold - Seated Isometric Cervical Extension  - 1 x daily - 7 x weekly - 1 sets - 5 reps - 5 hold - Seated Isometric Cervical Flexion  - 1 x daily - 7 x weekly - 1 sets - 5 reps - 5 hold - Seated Isometric Cervical Rotation  - 1 x daily - 7 x weekly - 1 sets - 5 reps - 5 hold - Prone Shoulder Extension - Single Arm  - 2 x daily - 7 x weekly - 2 sets - 10 reps - Prone Shoulder Row  - 2 x daily - 7 x weekly - 2 sets - 10 reps - Prone Single Arm Shoulder Horizontal Abduction with Scapular Retraction and Palm Down  - 2 x daily - 7 x weekly - 2 sets -  10 reps - Sidelying Shoulder External Rotation  - 2 x daily - 7 x weekly - 2 sets - 10 reps - Single Arm Serratus Punches in Supine with Dumbbell  - 2 x daily - 7 x weekly - 2 sets - 10 reps - Standing Shoulder Flexion to 90 Degrees with Dumbbells  - 2 x daily - 7 x weekly - 2 sets - 10 reps - Standing Shoulder Scaption  - 2 x daily - 7 x weekly - 2 sets - 10 repsMD had given seated WITY, doorway stretch and serratus punches in supine, added cervical retraction    ASSESSMENT:  CLINICAL IMPRESSION: Carlisha reached her end of cert.  We had a lengthy discussion about her meeting a plateau.  She has opened up about multiple emotional issues that she has been dealing with since moving here.  She will be meeting with  a counselor to start therapy for this.  She is independent with her HEP.  Her current pain levels and headaches are likely greatly affected by her emotional state impeding her ability to make further progress at this time. She is encouraged to be as consistent as possible with her HEP.  She understands that if the counseling sessions are going well, and her pain levels are better controlled, she may be a candidate to resume PT.  She is encouraged to keep open communication with her providers.  We will DC at this time with max potential met.     OBJECTIVE IMPAIRMENTS: decreased ROM, decreased strength, dizziness, hypomobility, increased fascial restrictions, increased muscle spasms, impaired flexibility, impaired UE functional use, postural dysfunction, and pain.   ACTIVITY LIMITATIONS: carrying, lifting, sleeping, transfers, bed mobility, reach over head, hygiene/grooming, and caring for others  PARTICIPATION LIMITATIONS: meal prep, cleaning, laundry, driving, shopping, community activity, yard work, and church  PERSONAL FACTORS: Age, Past/current experiences, and Time since onset of injury/illness/exacerbation are also affecting patient's functional outcome.   REHAB POTENTIAL: Good  CLINICAL DECISION MAKING: Stable/uncomplicated  EVALUATION COMPLEXITY: Low   GOALS: Goals reviewed with patient? Yes  SHORT TERM GOALS: Target date: 12/06/2023   Pain report to be no greater than 4/10  Baseline:  Goal status: In progress  2.  Patient will be independent with initial HEP  Baseline:  Goal status: MET 12/18/23   LONG TERM GOALS: Target date: 01/03/2024   Patient to report pain no greater than 2/10  Baseline:  Goal status: Not met  2.  Patient to be independent with advanced HEP  Baseline:  Goal status: MET 01/03/24  3.  Patient to be able to wake and rise with pain no greater than 2/10 Baseline:  Goal status: Not met  4.  C spine ROM to improve by 5-8 degrees on rotation and side  bending Baseline:  Goal status: MET on rotation but not side bending  5.  Patient to report 85% improvement in overall symptoms  Baseline:  Goal status: Not met  6.  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 Baseline:  Goal status: MET but she admits that this past showcase, she didn't participate much due to not knowing the dances and didn't feel like her neck was doing well   PLAN:  PT FREQUENCY: 2x/week  PT DURATION: 8 weeks  PLANNED INTERVENTIONS: 97110-Therapeutic exercises, 97530- Therapeutic activity, [id number]- Neuromuscular re-education, 97535- Self Care, 30865- Manual therapy, [id number]- Canalith repositioning, [id number]- Aquatic Therapy, 97014- Electrical stimulation (unattended), [id number]- Electrical stimulation (manual), [id number]- Vasopneumatic device, [id number]- Ultrasound,  16109- Traction (mechanical), 60454- Ionotophoresis 4mg /ml Dexamethasone, Patient/Family education, Balance training, Stair training, Taping, Dry Needling, Joint mobilization, Spinal mobilization, Vestibular training, Visual/preceptual remediation/compensation, Cryotherapy, and Moist heat  PLAN FOR NEXT SESSION: DC   Jahaziel Francois B. Vasilis Luhman, PT 01/03/24 1:25 PM [facility] [address] [place], [place] [address] Phone # [phone] Fax [phone]

## 2024-01-04 NOTE — Progress Notes (Signed)
 [facility] [address] [place] [address] Phone: [phone] Subjective:   Paige Suarez, am serving as a scribe for Dr. Antoine Primas.  I'm seeing this patient by the request  of:  Joselyn Arrow, MD  CC: neck pain follow up   [id number]  11/06/2023 Patient does have severe arthritis and does have some acute changes that are likely secondary to some traumatic arthritic changes noted as well.  Will get laboratory workup to make sure nothing else is potentially contributing.  Discussed icing regimen and home exercises, patient will start with formal physical therapy which I think will be more beneficial.  We discussed different medications including the possibility of gabapentin which patient declined.  Follow-up with me again in 6 to 8 weeks.  If continuing to have difficulty advanced imaging would be warranted     Patient is due for another bone density in February.      Update 01/07/2024 Paige Suarez is a 73 y.o. female coming in with complaint of cervical spine pain. Patient has been doing PT. Patient states doing well, but since ending PT pains are still present but reduced. Can't use a pillow.       Past Medical History:  Diagnosis Date   Anxiety    Arthritis    Cataract [id number]   No surgery yet   Osteopenia    Osteoporosis    Past Surgical History:  Procedure Laterality Date   ESOPHAGOGASTRODUODENOSCOPY  09/27/2017   small hiatal hernia, reflux, esophagitis, erosive gastropathy   EYE SURGERY     Lasik   ovarectomy     benign   WRIST SURGERY Right    ligament tear, repaired   Social History   Socioeconomic History   Marital status: Married    Spouse name: Not on file   Number of children: Not on file   Years of education: Not on file   Highest education level: Associate degree: academic program  Occupational History   Not on file  Tobacco Use   Smoking status: Former    Current packs/day: 0.00    Types:  Cigarettes    Quit date: 1999    Years since quitting: 26.2   Smokeless tobacco: Never  Vaping Use   Vaping status: Never Used  Substance and Sexual Activity   Alcohol use: Not Currently    Comment: One every 3 months ir so   Drug use: Not Currently    Types: Marijuana   Sexual activity: Yes    Birth control/protection: None  Other Topics Concern   Not on file  Social History Narrative   Married.   Moved from [place], [place]   2 daughters (1 in [place], 1 in [place])   No pets      Enjoys dancing (line dancing)   Social Drivers of Health   Financial Resource Strain: Low Risk  (12/11/2023)   Overall Financial Resource Strain (CARDIA)    Difficulty of Paying Living Expenses: Not hard at all  Food Insecurity: No Food Insecurity (12/11/2023)   Hunger Vital Sign    Worried About Running Out of Food in the Last Year: Never true    Ran Out of Food in the Last Year: Never true  Transportation Needs: Patient Declined (12/11/2023)   PRAPARE - Transportation    Lack of Transportation (Medical): Patient declined    Lack of Transportation (Non-Medical): Patient declined  Physical Activity: Insufficiently Active (12/11/2023)   Exercise Vital Sign    Days of  Exercise per Week: 1 day    Minutes of Exercise per Session: 120 min  Stress: No Stress Concern Present (12/11/2023)   [employer] of Occupational Health - Occupational Stress Questionnaire    Feeling of Stress : Not at all  Social Connections: Unknown (12/11/2023)   Social Connection and Isolation Panel [NHANES]    Frequency of Communication with Friends and Family: Once a week    Frequency of Social Gatherings with Friends and Family: Three times a week    Attends Religious Services: Patient declined    Active Member of Clubs or Organizations: Yes    Attends Banker Meetings: 1 to 4 times per year    Marital Status: Married   Allergies  Allergen Reactions   Sulfa Antibiotics    Oxycodone Nausea And Vomiting and  Rash    Pt states any strong pain medication affects her   Family History  Problem Relation Age of Onset   Cancer Father        brain   Heart disease Father    Stroke Father    Heart attack Sister 70   Obesity Daughter     Current Outpatient Medications (Endocrine & Metabolic):    alendronate (FOSAMAX) 70 MG tablet, Take 1 tablet (70 mg total) by mouth once a week. Take with a full glass of water on an empty stomach.    Current Outpatient Medications (Analgesics):    allopurinol (ZYLOPRIM) 100 MG tablet, Take 2 tablets (200 mg total) by mouth daily.   ibuprofen (ADVIL) 200 MG tablet, Take 200 mg by mouth every 6 (six) hours as needed.   Current Outpatient Medications (Other):    Calcium-Magnesium-Vitamin D (CALCIUM MAGNESIUM PO), Take 2 tablets by mouth daily.   clonazePAM (KLONOPIN) 0.5 MG tablet, TAKE 1/2 TO 1 TABLET BY MOUTH 2 TIMES DAILY AS NEEDED FOR ANXIETY. USE CAUTION WITH DRIVING IF TAKING DURING THE DAY   COLOSTRUM PO, Take 1 capsule by mouth daily.   estradiol (ESTRACE) 0.1 MG/GM vaginal cream, Place 1 Applicatorful vaginally 4 (four) times a week.   Estradiol 10 MCG TABS vaginal tablet, Place 1 tablet vaginally 2 (two) times a week.   methocarbamol (ROBAXIN) 500 MG tablet, Take 1-2 tablets (500-1,000 mg total) by mouth every 8 (eight) hours as needed for muscle spasms.   Probiotic Product (PRO-BIOTIC BLEND PO), Take 1 capsule by mouth daily.   traZODone (DESYREL) 50 MG tablet, Take 1 tablet (50 mg total) by mouth at bedtime.   TURMERIC PO, Take 2,250 mg by mouth daily.   VITAMIN A PO, Take 1 capsule by mouth daily.   VITAMIN D, CHOLECALCIFEROL, PO, Take 800 Int'l Units/L by mouth.   Reviewed prior external information including notes and imaging from  primary care provider As well as notes that were available from care everywhere and other healthcare systems.  Past medical history, social, surgical and family history all reviewed in electronic medical record.  No  pertanent information unless stated regarding to the chief complaint.   Review of Systems:  No headache, visual changes, nausea, vomiting, diarrhea, constipation, dizziness, abdominal pain, skin rash, fevers, chills, night sweats, weight loss, swollen lymph nodes, body aches, joint swelling, chest pain, shortness of breath, mood changes. POSITIVE muscle aches  Objective  Blood pressure 110/72, pulse 62, height 5\' 1"  (1.549 m), SpO2 96%.   General: No apparent distress alert and oriented x3 mood and affect normal, dressed appropriately.  HEENT: Pupils equal, extraocular movements intact  Respiratory: Patient's speak in  full sentences and does not appear short of breath  Cardiovascular: No lower extremity edema, non tender, no erythema  Neck exam shows patient does have some crepitus noted.  Does have some voluntary guarding noted.  No significant swelling noted.  Limited sidebending of the neck bilaterally.    Impression and Recommendations:    The above documentation has been reviewed and is accurate and complete Judi Saa, DO

## 2024-01-07 ENCOUNTER — Ambulatory Visit (INDEPENDENT_AMBULATORY_CARE_PROVIDER_SITE_OTHER): Payer: Medicare Other | Admitting: Family Medicine

## 2024-01-07 VITALS — BP 110/72 | HR 62 | Ht 61.0 in

## 2024-01-07 DIAGNOSIS — M5030 Other cervical disc degeneration, unspecified cervical region: Secondary | ICD-10-CM

## 2024-01-07 DIAGNOSIS — I6523 Occlusion and stenosis of bilateral carotid arteries: Secondary | ICD-10-CM | POA: Insufficient documentation

## 2024-01-07 DIAGNOSIS — R9389 Abnormal findings on diagnostic imaging of other specified body structures: Secondary | ICD-10-CM | POA: Diagnosis not present

## 2024-01-07 DIAGNOSIS — E790 Hyperuricemia without signs of inflammatory arthritis and tophaceous disease: Secondary | ICD-10-CM | POA: Diagnosis not present

## 2024-01-07 MED ORDER — ALLOPURINOL 100 MG PO TABS: 200.0000 mg | ORAL_TABLET | Freq: Every day | ORAL | 2 refills | Status: DC

## 2024-01-07 NOTE — Assessment & Plan Note (Signed)
 Doppler ordered and pending.

## 2024-01-07 NOTE — Patient Instructions (Addendum)
 Allopurinol 200mg  Carotid doppler Tell husband just being conservative Keep doing exercises Upright Go See you again in 2-3 months

## 2024-01-07 NOTE — Assessment & Plan Note (Signed)
 Elevated uric acid, did not tolerate the tart cherry very well so we will try the treatment of the allopurinol.  Warned the potential side effects.  Hopeful patient will be able to tolerate it.  Follow-up again in 6 to 8 weeks otherwise.

## 2024-01-07 NOTE — Assessment & Plan Note (Signed)
 Known arthritic changes.  Has made some improvement with formal physical therapy.  Will get other treatment with home exercises.  Worsening symptoms do need to consider advanced imaging.  Hopeful that this will make a difference.

## 2024-01-09 ENCOUNTER — Ambulatory Visit: Admitting: Licensed Clinical Social Worker

## 2024-01-15 ENCOUNTER — Ambulatory Visit (INDEPENDENT_AMBULATORY_CARE_PROVIDER_SITE_OTHER): Admitting: Licensed Clinical Social Worker

## 2024-01-15 DIAGNOSIS — F4321 Adjustment disorder with depressed mood: Secondary | ICD-10-CM

## 2024-01-15 NOTE — Progress Notes (Signed)
 [facility] Counselor/Therapist Progress Note  Patient ID: Paige Suarez, MRN: [id number]   Date: 01/15/24  Time Spent: 2:00  pm - 2:59 pm : 59 Minutes  Treatment Type: Individual Therapy.  Reported Symptoms: feeling sad and disgusted that her daughter does not want to connect with her and the family but also optimistic  Mental Status Exam: Appearance:  Well Groomed     Behavior: Sharing  Motor: Normal  Speech/Language:  Normal Rate  Affect: Appropriate  Mood: normal  Thought process: normal  Thought content:   WNL  Sensory/Perceptual disturbances:   WNL  Orientation: oriented to person, place, and time/date  Attention: Good  Concentration: Good  Memory: WNL  Fund of knowledge:  Good  Insight:   Good  Judgment:  Good  Impulse Control: Good   Risk Assessment: Danger to Self:  No Self-injurious Behavior: No Danger to Others: No Duty to Warn:no Physical Aggression / Violence:No  Access to Firearms a concern: No  Gang Involvement:No   Subjective:   Joaquim Lai participated from home, via video and consented to treatment. Therapist participated from home office. I discussed the limitations of evaluation and management by telemedicine and the availability of in person appointments. The patient expressed understanding and agreed to proceed. Madoline reviewed the events of the past week.      We reviewed numerous treatment approaches including CBT and Solution focused therapy. Psych-education regarding the Ahnna's diagnosis of Adjustment disorder with depressed mood was provided during the session. We discussed Minola Bour's goals treatment goals which include goal of moving into acceptance of things she is not able to control.  Focus on identifying triggers, fears, and concerns in order to create space for promoting her self care and ability to independently regulate her emotions to regain power and peace.  Wants to add time value to her permission to walk  away without guilt and put this into the center of her life. Joaquim Lai provided verbal approval of the treatment plan.   Interventions: Psycho-education & Goal Setting.   Diagnosis:  Adjustment disorder with depressed mood  Psychiatric Treatment: No , N/A   Treatment Plan:  Client Abilities/Strengths Nicky is actively interested in her well being and coming into acceptance that her daughter does not want a relationship with her and the family.    Support System: Family and Friends  Health and safety inspector  Treatment Level Weekly  Symptoms  Sad   (Status: maintained) Disgusted   (Status: maintained)  Goals:   Krystena agreed to set goal of moving into acceptance of things she is not able to control.  Focus on identifying triggers, fears, and concerns in order to create space for promoting her self care and ability to independently regulate her emotions to regain power and peace.  Wants to add time value to her permission to walk away without guilt and put this into the center of her life.    Treatment plan signed and available on s-drive:  Yes    Target Date: 02/14/24 Frequency: Weekly  Progress: 0 Modality: individual    Therapist will provide referrals for additional resources as appropriate.  Therapist will provide psycho-education regarding Indria's diagnosis and corresponding treatment approaches and interventions. Licensed Clinical Mental Health Counselor, Anselmo Pickler, [facility] will support the patient's ability to achieve the goals identified. will employ CBT, BA, Problem-solving, Solution Focused, Mindfulness,  coping skills, & other evidenced-based practices will be used to promote progress towards healthy functioning to help manage decrease symptoms associated with her  diagnosis.   Reduce overall level, frequency, and intensity of the feelings of depression, anxiety and panic evidenced by increased acceptance and emotional regulation to reduce  depression. Verbally express understanding of the relationship between feelings of depression, anxiety and their impact on thinking patterns and behaviors. Verbalize an understanding of the role that distorted thinking plays in creating fears, excessive worry, and ruminations.    Liborio Nixon participated in the creation of the treatment plan)    Anselmo Pickler, [facility]

## 2024-01-21 LAB — HM MAMMOGRAPHY

## 2024-01-21 LAB — HM DEXA SCAN

## 2024-01-23 ENCOUNTER — Ambulatory Visit: Admitting: Licensed Clinical Social Worker

## 2024-01-23 DIAGNOSIS — F4321 Adjustment disorder with depressed mood: Secondary | ICD-10-CM

## 2024-01-23 NOTE — Progress Notes (Signed)
 [facility] Counselor/Therapist Progress Note  Patient ID: Paige Suarez, MRN: [id number]    Date: 01/23/24  Time Spent: 2:03  pm -  2:59pm : 56 Minutes  Treatment Type: Individual Therapy.  Reported Symptoms: let down, resentful, disapproving, accepted, valued and respected  Mental Status Exam: Appearance:  Well Groomed     Behavior: Sharing and Rationalizing  Motor: Normal  Speech/Language:  Normal Rate  Affect: Depressed  Mood: sad  Thought process: goal directed  Thought content:   WNL  Sensory/Perceptual disturbances:   WNL  Orientation: oriented to person, place, and time/date  Attention: Good  Concentration: Good  Memory: WNL  Fund of knowledge:  Good  Insight:   Good  Judgment:  Good  Impulse Control: Good   Risk Assessment: Danger to Self:  No Self-injurious Behavior: No Danger to Others: No Duty to Warn:no Physical Aggression / Violence:No  Access to Firearms a concern: No  Gang Involvement:No   Subjective:   Joaquim Lai participated from home, via video, and consented to treatment. I discussed the limitations of evaluation and management by telemedicine and the availability of in person appointments. The patient expressed understanding and agreed to proceed.  Therapist participated from home office.  Breniya reviewed the events of the past week.      Interventions: Cognitive Behavioral Therapy and Insight-Oriented  Diagnosis:  Adjustment disorder with depressed mood  Psychiatric Treatment: No , N/A  Treatment Plan:  Client Abilities/Strengths Laylaa is committed to attending sessions to help manage emotions.  Support System: Family and Friends  Merchant navy officer of Needs Venora would like to move to acceptance of things outside of her control.     Treatment Level Weekly  Symptoms  Resentful, Disapproving, Judgmental   (Status: maintained) Hopeful, optimistic, peaceful   (Status:  maintained)  Goals:   Briannon experiences symptoms of has been able to code her anger as resentment, judgmental behavior, hurt, and disappointed.On the positive side, she is feeling more hopeful, thankful, and feeling valued and connected to friends.     Target Date: 02/14/24 Frequency: Weekly  Progress: 0 Modality: individual    Therapist will provide referrals for additional resources as appropriate.  Therapist will provide psycho-education regarding Cadee's diagnosis and corresponding treatment approaches and interventions. Licensed Clinical Mental Health Counselor, Anselmo Pickler, [facility] will support the patient's ability to achieve the goals identified. will employ CBT, BA, Problem-solving, Solution Focused, Mindfulness,  coping skills, & other evidenced-based practices will be used to promote progress towards healthy functioning to help manage decrease symptoms associated with her diagnosis.   Reduce overall level, frequency, and intensity of the feelings of depression, anxiety and panic evidenced by decreased negative thought cycles and being on edge about being estranged from one of her daughters.   Verbally express understanding of the relationship between feelings of depression, anxiety and their impact on thinking patterns and behaviors. Verbalize an understanding of the role that distorted thinking plays in creating fears, excessive worry, and ruminations.  Liborio Nixon participated in the creation of the treatment plan)   Anselmo Pickler, [facility]

## 2024-01-24 ENCOUNTER — Ambulatory Visit (HOSPITAL_COMMUNITY)
Admission: RE | Admit: 2024-01-24 | Discharge: 2024-01-24 | Disposition: A | Discharge status: Home / Self Care | Source: Ambulatory Visit | Attending: Cardiovascular Disease | Admitting: Cardiovascular Disease

## 2024-01-24 DIAGNOSIS — R9389 Abnormal findings on diagnostic imaging of other specified body structures: Secondary | ICD-10-CM | POA: Insufficient documentation

## 2024-01-24 DIAGNOSIS — M5030 Other cervical disc degeneration, unspecified cervical region: Secondary | ICD-10-CM | POA: Insufficient documentation

## 2024-01-24 DIAGNOSIS — I6523 Occlusion and stenosis of bilateral carotid arteries: Secondary | ICD-10-CM | POA: Diagnosis not present

## 2024-01-25 ENCOUNTER — Encounter: Payer: Self-pay | Admitting: "Endocrinology

## 2024-01-25 ENCOUNTER — Ambulatory Visit (INDEPENDENT_AMBULATORY_CARE_PROVIDER_SITE_OTHER): Admitting: "Endocrinology

## 2024-01-25 VITALS — BP 112/80 | HR 51 | Ht 61.0 in | Wt 100.0 lb

## 2024-01-25 DIAGNOSIS — M810 Age-related osteoporosis without current pathological fracture: Secondary | ICD-10-CM

## 2024-01-25 NOTE — Progress Notes (Signed)
 [facility] Note Paige Fanwood, MD    Referring Provider: Joselyn Arrow, MD Primary Care Provider: Joselyn Arrow, MD No chief complaint on file.    Assessment & Plan  Diagnoses and all orders for this visit:  Age-related osteoporosis without current pathological fracture     Osteoporosis Likely secondary cause from age.  BMD results suggest: -2.6 at L hip in 12/12/2021 . Due next 12/2023. Continue current combination of calcium 750 mg every day with Mg 375 mg every day, with Vit D 400 units bid.    On fosamax 70 mg weekly without S/E. She has been on fosamax from 2002-2011 and again from Sep 2021-current.  Educated on risks and side effects of fosamax including but not limited to esophagitis, worsening GERD, atypical femoral fractures and osteonecrosis of the jaw. Advised to take medication first thing in the morning with plenty of water and stay upright for 30 minutes after taking the medication.  Follow fall precautions, adequate dairy in diet and exercises (aerobic, balancing and weight bearing) as tolerated.   Return in about 1 year (around 01/24/2025).  I have reviewed current medications, nurse's notes, allergies, vital signs, past medical and surgical history, family medical history, and social history for this encounter. Counseled patient on symptoms, examination findings, lab findings, imaging results, treatment decisions and monitoring and prognosis. The patient understood the recommendations and agrees with the treatment plan. All questions regarding treatment plan were fully answered.   Paige [place], MD   01/25/24    History of Present Illness Paige Suarez is a 73 y.o. year old female who presents to our clinic with osteoporosis.  She has been on fosamax from 2002-2011 and again from Sep 2021-current. No S/E.  Currently taking combination of calcium 750 mg every day with Mg 375 mg every day, with Vit D 400 units bid.  No falls/fractures  Dances,  walks   Recently has DXA scan, need to chase results    Risk Factors screening:  History of low trauma fractures: Yes, twice R wrist fracture Family history of osteoporosis: No Hip fracture in first-degree relatives: No Smoking history: No, quit in 1999 Excessive alcohol intake >2 drinks/day: No Excessive caffeine intake >2 drinks/day: No Glucocorticoid use >5mg  prednisone/day for >3 months: No Rheumatoid arthritis history: No Premature/Surgical Menopause: No  Component     Latest Ref Rng 07/19/2023  Sodium     135 - 145 mEq/L 137   Potassium     3.5 - 5.1 mEq/L 4.6   Chloride     96 - 112 mEq/L 102   CO2     19 - 32 mEq/L 28   Albumin     3.5 - 5.2 g/dL 4.5   BUN     6 - 23 mg/dL 14   Creatinine     7.82 - 1.20 mg/dL 9.56   Glucose     70 - 99 mg/dL 88   Phosphorus     2.3 - 4.6 mg/dL 3.2   GFR     >21.30 mL/min 82.39   Calcium     8.6 - 10.4 mg/dL 9.9   Calcium     8.4 - 10.5 mg/dL 9.8   Vitamin D 1, 25 (OH) Total     18 - 72 pg/mL 33   Vitamin D3 1, 25 (OH)     pg/mL 33   Vitamin D2 1, 25 (OH)     pg/mL <8   PTH, Intact     16 -  77 pg/mL 29   VITD     30.00 - 100.00 ng/mL 43.03   Magnesium     1.5 - 2.5 mg/dL 2.4     Physical Exam  BP 112/80   Pulse (!) 51   Ht 5\' 1"  (1.549 m)   Wt 100 lb (45.4 kg)   SpO2 99%   BMI 18.89 kg/m  Constitutional: well developed, well nourished Head: normocephalic, atraumatic Eyes: sclera anicteric, no redness Neck: supple Lungs: normal respiratory effort Neurology: alert and oriented Skin: dry, no appreciable rashes Musculoskeletal: no appreciable defects Psychiatric: normal mood and affect  Allergies Allergies  Allergen Reactions   Sulfa Antibiotics    Oxycodone Nausea And Vomiting and Rash    Pt states any strong pain medication affects her    Current Medications Patient's Medications  New Prescriptions   No medications on file  Previous Medications   ALENDRONATE (FOSAMAX) 70 MG TABLET    Take  1 tablet (70 mg total) by mouth once a week. Take with a full glass of water on an empty stomach.   ALLOPURINOL (ZYLOPRIM) 100 MG TABLET    Take 2 tablets (200 mg total) by mouth daily.   CALCIUM-MAGNESIUM-VITAMIN D (CALCIUM MAGNESIUM PO)    Take 2 tablets by mouth daily.   CLONAZEPAM (KLONOPIN) 0.5 MG TABLET    TAKE 1/2 TO 1 TABLET BY MOUTH 2 TIMES DAILY AS NEEDED FOR ANXIETY. USE CAUTION WITH DRIVING IF TAKING DURING THE DAY   COLOSTRUM PO    Take 1 capsule by mouth daily.   ESTRADIOL (ESTRACE) 0.1 MG/GM VAGINAL CREAM    Place 1 Applicatorful vaginally 4 (four) times a week.   ESTRADIOL 10 MCG TABS VAGINAL TABLET    Place 1 tablet vaginally 2 (two) times a week.   IBUPROFEN (ADVIL) 200 MG TABLET    Take 200 mg by mouth every 6 (six) hours as needed.   METHOCARBAMOL (ROBAXIN) 500 MG TABLET    Take 1-2 tablets (500-1,000 mg total) by mouth every 8 (eight) hours as needed for muscle spasms.   PROBIOTIC PRODUCT (PRO-BIOTIC BLEND PO)    Take 1 capsule by mouth daily.   TRAZODONE (DESYREL) 50 MG TABLET    Take 1 tablet (50 mg total) by mouth at bedtime.   TURMERIC PO    Take 2,250 mg by mouth daily.   VITAMIN A PO    Take 1 capsule by mouth daily.   VITAMIN D, CHOLECALCIFEROL, PO    Take 800 Int'l Units/L by mouth.  Modified Medications   No medications on file  Discontinued Medications   No medications on file     Past Medical History Past Medical History:  Diagnosis Date   Anxiety    Arthritis    Cataract [id number]   No surgery yet   Osteopenia    Osteoporosis     Past Surgical History Past Surgical History:  Procedure Laterality Date   ESOPHAGOGASTRODUODENOSCOPY  09/27/2017   small hiatal hernia, reflux, esophagitis, erosive gastropathy   EYE SURGERY     Lasik   ovarectomy     benign   WRIST SURGERY Right    ligament tear, repaired    Family History family history includes Cancer in her father; Heart attack (age of onset: 62) in her sister; Heart disease in her father;  Obesity in her daughter; Stroke in her father.  Social History Social History   Socioeconomic History   Marital status: Married    Spouse name: Not on file  Number of children: Not on file   Years of education: Not on file   Highest education level: Associate degree: academic program  Occupational History   Not on file  Tobacco Use   Smoking status: Former    Current packs/day: 0.00    Types: Cigarettes    Quit date: 1999    Years since quitting: 26.2   Smokeless tobacco: Never  Vaping Use   Vaping status: Never Used  Substance and Sexual Activity   Alcohol use: Not Currently    Comment: One every 3 months ir so   Drug use: Not Currently    Types: Marijuana   Sexual activity: Yes    Birth control/protection: None  Other Topics Concern   Not on file  Social History Narrative   Married.   Moved from [place], [place]   2 daughters (1 in [place], 1 in [place])   No pets      Enjoys dancing (line dancing)   Social Drivers of Health   Financial Resource Strain: Low Risk  (12/11/2023)   Overall Financial Resource Strain (CARDIA)    Difficulty of Paying Living Expenses: Not hard at all  Food Insecurity: No Food Insecurity (12/11/2023)   Hunger Vital Sign    Worried About Running Out of Food in the Last Year: Never true    Ran Out of Food in the Last Year: Never true  Transportation Needs: Patient Declined (12/11/2023)   PRAPARE - Transportation    Lack of Transportation (Medical): Patient declined    Lack of Transportation (Non-Medical): Patient declined  Physical Activity: Insufficiently Active (12/11/2023)   Exercise Vital Sign    Days of Exercise per Week: 1 day    Minutes of Exercise per Session: 120 min  Stress: No Stress Concern Present (12/11/2023)   [employer] of Occupational Health - Occupational Stress Questionnaire    Feeling of Stress : Not at all  Social Connections: Unknown (12/11/2023)   Social Connection and Isolation Panel [NHANES]    Frequency of  Communication with Friends and Family: Once a week    Frequency of Social Gatherings with Friends and Family: Three times a week    Attends Religious Services: Patient declined    Active Member of Clubs or Organizations: Yes    Attends Banker Meetings: 1 to 4 times per year    Marital Status: Married  Catering manager Violence: Unknown (02/09/2023)   Received from [employer]   Intimate Partner Violence    Denied Basic Needs: Not on file    In the past 12 months have you been in a relationship with a person who hurts, threatens, or tries to control you?: No    Worried food would run out: Not on file    In the past 12 months have you been in a relationship with a person who hurts, threatens, or tries to control you?: No    Laboratory Investigations No components found for: "CMP" No components found for: "BMP" Lab Results  Component Value Date   GFR 79.56 11/06/2023   Lab Results  Component Value Date   CREATININE 0.74 11/06/2023   No results found for: "CBC" No components found for: "LFT" No components found for: "VITD" Lab Results  Component Value Date   PTH 24 11/06/2023   Lab Results  Component Value Date   TSH 2.02 11/06/2023    No components found for: "RENAL FUNCTION" No components found for: "MAGNESIUM"  Parts of this note may have been dictated  using voice recognition software. There may be variances in spelling and vocabulary which are unintentional. Not all errors are proofread. Please notify the Thereasa Parkin if any discrepancies are noted or if the meaning of any statement is not clear.

## 2024-01-26 ENCOUNTER — Encounter: Payer: Self-pay | Admitting: Family Medicine

## 2024-01-29 ENCOUNTER — Encounter: Payer: Self-pay | Admitting: Family Medicine

## 2024-01-31 ENCOUNTER — Ambulatory Visit: Admitting: Licensed Clinical Social Worker

## 2024-01-31 DIAGNOSIS — F4321 Adjustment disorder with depressed mood: Secondary | ICD-10-CM

## 2024-01-31 NOTE — Progress Notes (Signed)
 [facility] Counselor/Therapist Progress Note  Patient ID: Paige Suarez, MRN: [id number]    Date: 01/31/24  Time Spent: 9:01  am - 10:02 am : 61 Minutes  Treatment Type: Individual Therapy.  Reported Symptoms: has been having issues, not feeling like she has come to terms with their disconnection and making peace with it, no desire to contact her at this point   Mental Status Exam: Appearance:  Well Groomed     Behavior: Appropriate  Motor: Normal  Speech/Language:  Normal Rate  Affect: Appropriate  Mood: normal  Thought process: normal  Thought content:   WNL  Sensory/Perceptual disturbances:   WNL  Orientation: oriented to person, place, and time/date  Attention: Good  Concentration: Good  Memory: WNL  Fund of knowledge:  Good  Insight:   Good  Judgment:  Good  Impulse Control: Good   Risk Assessment: Danger to Self:  No Self-injurious Behavior: No Danger to Others: No Duty to Warn:no Physical Aggression / Violence:No  Access to Firearms a concern: No  Gang Involvement:No   Subjective:   Paige Suarez participated from home, via video, and consented to treatment. I discussed the limitations of evaluation and management by telemedicine and the availability of in person appointments. The patient expressed understanding and agreed to proceed.  Therapist participated from home office.  Paige Suarez reviewed the events of the past week.      Interventions: Solution-Oriented/Positive Psychology and Insight-Oriented  Diagnosis:  Adjustment disorder with depressed mood   Psychiatric Treatment: No , N/A  Treatment Plan:  Client Abilities/Strengths Paige Suarez is very strong in doing what she desires and standing up for herself.   Support System: Family and Friends  Merchant navy officer of Needs Paige Suarez would like to continue to work on herself during sessions and gain more insight into acceptance of her new lifestyle in  [place] and connections made and lost due to this move.     Treatment Level Weekly  Symptoms  Anxious   (Status: maintained) Peace   (Status: improved)  Goals:   Paige Suarez is anticipating a phone cal from her daughter at some point needing support and they always aid and this time she feels bad that if it happens she does not want to help her anymore.  This is her new boundary.  Shares she is very pleased with sessions and her growth.  She recognizes that the more she moves into acceptance the less her depression gets but we are seeing her anxiety increase as she explores what is next.  Will continue to monitor and attend during goal target.     Target Date: 02/14/24 Frequency: Weekly  Progress: 0 Modality: individual    Therapist will provide referrals for additional resources as appropriate.  Therapist will provide psycho-education regarding Paige Suarez's diagnosis and corresponding treatment approaches and interventions. Licensed Clinical Mental Health Counselor, Paige Suarez, [facility] will support the patient's ability to achieve the goals identified. will employ CBT, BA, Problem-solving, Solution Focused, Mindfulness,  coping skills, & other evidenced-based practices will be used to promote progress towards healthy functioning to help manage decrease symptoms associated with her diagnosis.   Reduce overall level, frequency, and intensity of the feelings of depression, anxiety and panic evidenced by increased mood and acceptance in being in .  Verbally express understanding of the relationship between feelings of depression, anxiety and their impact on thinking patterns and behaviors. Verbalize an understanding of the role that distorted thinking plays in creating fears, excessive worry, and ruminations.  (  Paige Suarez participated in the creation of the treatment plan)   Grandville Lax, LCMHC

## 2024-02-04 ENCOUNTER — Encounter: Payer: Self-pay | Admitting: Family Medicine

## 2024-02-04 DIAGNOSIS — R197 Diarrhea, unspecified: Secondary | ICD-10-CM

## 2024-02-04 NOTE — Telephone Encounter (Signed)
 Labs ordered.  Ensure has lab visit. Doesn't need to be fasting.

## 2024-02-04 NOTE — Telephone Encounter (Signed)
 Need orders for labs for tomorrow please.

## 2024-02-05 ENCOUNTER — Other Ambulatory Visit

## 2024-02-05 ENCOUNTER — Encounter: Payer: Self-pay | Admitting: *Deleted

## 2024-02-05 DIAGNOSIS — R197 Diarrhea, unspecified: Secondary | ICD-10-CM

## 2024-02-07 LAB — CBC WITH DIFFERENTIAL/PLATELET
Basophils Absolute: 0.1 10*3/uL (ref 0.0–0.2)
Basos: 1 %
EOS (ABSOLUTE): 0.1 10*3/uL (ref 0.0–0.4)
Eos: 1 %
Hematocrit: 39.9 % (ref 34.0–46.6)
Hemoglobin: 13.2 g/dL (ref 11.1–15.9)
Immature Grans (Abs): 0 10*3/uL (ref 0.0–0.1)
Immature Granulocytes: 0 %
Lymphocytes Absolute: 1.2 10*3/uL (ref 0.7–3.1)
Lymphs: 17 %
MCH: 30.5 pg (ref 26.6–33.0)
MCHC: 33.1 g/dL (ref 31.5–35.7)
MCV: 92 fL (ref 79–97)
Monocytes Absolute: 0.6 10*3/uL (ref 0.1–0.9)
Monocytes: 8 %
Neutrophils Absolute: 5.4 10*3/uL (ref 1.4–7.0)
Neutrophils: 73 %
Platelets: 309 10*3/uL (ref 150–450)
RBC: 4.33 x10E6/uL (ref 3.77–5.28)
RDW: 13.7 % (ref 11.7–15.4)
WBC: 7.4 10*3/uL (ref 3.4–10.8)

## 2024-02-07 LAB — CELIAC DISEASE PANEL: IgA/Immunoglobulin A, Serum: 54 mg/dL — ABNORMAL LOW (ref 64–422)

## 2024-02-07 LAB — COMPREHENSIVE METABOLIC PANEL WITH GFR
ALT: 34 IU/L — ABNORMAL HIGH (ref 0–32)
AST: 36 IU/L (ref 0–40)
Albumin: 4.5 g/dL (ref 3.8–4.8)
Alkaline Phosphatase: 84 IU/L (ref 44–121)
BUN/Creatinine Ratio: 16 (ref 12–28)
BUN: 11 mg/dL (ref 8–27)
Bilirubin Total: <0.2 mg/dL (ref 0.0–1.2)
CO2: 23 mmol/L (ref 20–29)
Calcium: 9.7 mg/dL (ref 8.7–10.3)
Chloride: 102 mmol/L (ref 96–106)
Creatinine, Ser: 0.68 mg/dL (ref 0.57–1.00)
Globulin, Total: 2.5 g/dL (ref 1.5–4.5)
Glucose: 74 mg/dL (ref 70–99)
Potassium: 4.9 mmol/L (ref 3.5–5.2)
Sodium: 139 mmol/L (ref 134–144)
Total Protein: 7 g/dL (ref 6.0–8.5)
eGFR: 91 mL/min/{1.73_m2} (ref >59)

## 2024-02-07 LAB — SEDIMENTATION RATE: Sed Rate: 20 mm/h (ref 0–40)

## 2024-02-07 LAB — TSH: TSH: 1.32 u[IU]/mL (ref 0.450–4.500)

## 2024-02-07 LAB — C-REACTIVE PROTEIN: CRP: 6 mg/L (ref 0–10)

## 2024-02-07 LAB — TISSUE TRANSGLUTAMINASE, IGG: Tissue Transglut Ab: <2 U/mL (ref 0–5)

## 2024-02-11 ENCOUNTER — Ambulatory Visit (INDEPENDENT_AMBULATORY_CARE_PROVIDER_SITE_OTHER): Admitting: Licensed Clinical Social Worker

## 2024-02-11 DIAGNOSIS — F4321 Adjustment disorder with depressed mood: Secondary | ICD-10-CM | POA: Diagnosis not present

## 2024-02-11 NOTE — Progress Notes (Signed)
   [facility] Counselor/Therapist Progress Note  Patient ID: Paige Suarez, MRN: [id number]    Date: 02/11/24  Time Spent: 10:00  am - 10:53 am : 53 Minutes  Treatment Type: Individual Therapy.  Reported Symptoms: reported frustration with physical health but had an amazing time this weekend at her dance event  Mental Status Exam: Appearance:  Well Groomed     Behavior: Sharing  Motor: Normal  Speech/Language:  Normal Rate  Affect: Appropriate  Mood: normal  Thought process: normal  Thought content:   WNL  Sensory/Perceptual disturbances:   WNL  Orientation: oriented to person, place, and time/date  Attention: Good  Concentration: Good  Memory: WNL  Fund of knowledge:  Good  Insight:   Good  Judgment:  Good  Impulse Control: Good   Risk Assessment: Danger to Self:  No Self-injurious Behavior: No Danger to Others: No Duty to Warn:no Physical Aggression / Violence:No  Access to Firearms a concern: No  Gang Involvement:No   Subjective:   Paige Suarez participated from home, via video, and consented to treatment. I discussed the limitations of evaluation and management by telemedicine and the availability of in person appointments. The patient expressed understanding and agreed to proceed.  Therapist participated from home office.  Shelese reviewed the events of the past week.      Interventions: Retail buyer Meditation  Diagnosis:  Adjustment disorder with depressed mood  Psychiatric Treatment: No , N/A  Treatment Plan:  Client Abilities/Strengths Paige Suarez is becoming more accepting of her new life but still very self aware of her fears with adjusting.    Support System: Family and Friends  Merchant navy officer of Needs Paige Suarez would like to utilize the learned strategies to take care of herself as she adjust to her new life in [place].   Treatment  Level Weekly  Symptoms  Frustrated   (Status: declined) Happy   (Status: improved)  Goals:   Paige Suarez experiences symptoms of feeling frustrated with the current healthcare system support.  Has been having some gastrointestinal issues and trying to find some solutions as to why.  Had her dance event out of town that provided a relief and some joy/happiness because she was able to see her old friends and dance.     Target Date: 02/14/24 Frequency: Weekly  Progress: 75-80% Modality: individual    Therapist will provide referrals for additional resources as appropriate.  Therapist will provide psycho-education regarding Paige Suarez diagnosis and corresponding treatment approaches and interventions. Licensed Clinical Mental Health Counselor, Grandville Lax, [facility] will support the patient's ability to achieve the goals identified. will employ CBT, BA, Problem-solving, Solution Focused, Mindfulness,  coping skills, & other evidenced-based practices will be used to promote progress towards healthy functioning to help manage decrease symptoms associated with her diagnosis.   Reduce overall level, frequency, and intensity of the feelings of depression, anxiety and panic evidenced by decreased feelings of guilt from the disconnection from one of her daughters.   Verbally express understanding of the relationship between feelings of depression, anxiety and their impact on thinking patterns and behaviors. Verbalize an understanding of the role that distorted thinking plays in creating fears, excessive worry, and ruminations.  Paige Suarez participated in the creation of the treatment plan)   Grandville Lax, LCMHC

## 2024-02-12 ENCOUNTER — Other Ambulatory Visit: Payer: Self-pay | Admitting: Family Medicine

## 2024-02-12 DIAGNOSIS — F411 Generalized anxiety disorder: Secondary | ICD-10-CM

## 2024-02-12 NOTE — Telephone Encounter (Signed)
 Is this okay to refill?

## 2024-02-12 NOTE — Telephone Encounter (Signed)
 Refilled #30 only, no add'l refills, as she has visit 5/13.

## 2024-02-18 ENCOUNTER — Ambulatory Visit (INDEPENDENT_AMBULATORY_CARE_PROVIDER_SITE_OTHER): Admitting: Licensed Clinical Social Worker

## 2024-02-18 DIAGNOSIS — F4321 Adjustment disorder with depressed mood: Secondary | ICD-10-CM

## 2024-02-18 NOTE — Progress Notes (Signed)
 [facility] Counselor/Therapist Progress Note  Patient ID: Paige Suarez, MRN: [id number]    Date: 02/18/24  Time Spent: 10:00  am - 10:56 am : 56 Minutes  Treatment Type: Individual Therapy.  Reported Symptoms: reported feeling appreciative of our time together and that she has her "head on straight", reported feeling better about acceptance of the things she can control and change at this point.    Mental Status Exam: Appearance:  Well Groomed     Behavior: Sharing  Motor: Normal  Speech/Language:  Normal Rate  Affect: Appropriate  Mood: normal  Thought process: normal  Thought content:   WNL  Sensory/Perceptual disturbances:   WNL  Orientation: oriented to person, place, and time/date  Attention: Good  Concentration: Good  Memory: WNL  Fund of knowledge:  Good  Insight:   Good  Judgment:  Good  Impulse Control: Good   Risk Assessment: Danger to Self:  No Self-injurious Behavior: No Danger to Others: No Duty to Warn:no Physical Aggression / Violence:No  Access to Firearms a concern: No  Gang Involvement:No   Subjective:   Collie Days participated from home, via video, and consented to treatment. I discussed the limitations of evaluation and management by telemedicine and the availability of in person appointments. The patient expressed understanding and agreed to proceed.  Therapist participated from home office.  Diahanna reviewed the events of the past week.      Interventions: Solution-Oriented/Positive Psychology and Insight-Oriented  Diagnosis:  Adjustment disorder with depressed mood  Psychiatric Treatment: No , N/A  Treatment Plan:  Client Abilities/Strengths Valesha is open and receptive to sessions.    Support System: Family and Friends  Merchant navy officer of Needs Briahna would like to focus on her continued wellness offline at this time.     Treatment Level Goal accomplished and services  are paused at this time.  Will return if/when needed.    Symptoms  Hopeful   (Status: improved) Happy   (Status: improved)  Goals:   Makelle experiences symptoms of feeling more grounded and accepting of where she is adjusting to her new like in [place] and the relationships that have value for her.  She presents in a much healthier mindset.  Overall great progress.     Target Date: 02/18/24 Frequency:  As needed  Progress: 0 Modality: individual    Therapist will provide referrals for additional resources as appropriate.  Therapist will provide psycho-education regarding Rema's diagnosis and corresponding treatment approaches and interventions. Licensed Clinical Mental Health Counselor, Grandville Lax, [facility] will support the patient's ability to achieve the goals identified. will employ CBT, BA, Problem-solving, Solution Focused, Mindfulness,  coping skills, & other evidenced-based practices will be used to promote progress towards healthy functioning to help manage decrease symptoms associated with her diagnosis.   Reduce overall level, frequency, and intensity of the feelings of depression, anxiety and panic evidenced by decreased Verbally express understanding of the relationship between feelings of depression,  anxiety and their impact on thinking patterns and behaviors. Verbalize an understanding of the role that distorted thinking plays in creating fears, excessive worry, and ruminations.  Leola Raisin participated in the creation of the treatment plan)   Grandville Lax, LCMHC

## 2024-02-25 DIAGNOSIS — K580 Irritable bowel syndrome with diarrhea: Secondary | ICD-10-CM | POA: Insufficient documentation

## 2024-02-25 DIAGNOSIS — G4700 Insomnia, unspecified: Secondary | ICD-10-CM | POA: Insufficient documentation

## 2024-02-25 NOTE — Progress Notes (Unsigned)
 No chief complaint on file.  Paige Suarez is a 73 y.o. female who presents for annual wellness visit and follow-up on chronic medical conditions.  She has the following concerns:   Diarrhea--She has been having diarrhea since January. Discussed with me in early March. Was having 1-2 episodes of diarrhea daily, on average. She reported being under a lot of stress from the move and being in a new area. One daughter has stopped speaking to her (related to politics) and the other daughter is getting divorced   This was evaluated last month with labs--negative celiac panel, normal CBC, c-met (ALT minimally elevated at 34, otherwise normal), TSH, CRP, ESR  She has h/o IBS: She previously reported that her diarrhea had resolved with treatments through holistic doctor.  Had done very well until January.   She reports h/o nausea/vomiting when she stops (gets off) treadmill, also after exercise bike and yoga. Recalls having EGD and stress test to evaluate. EGD showed esophageal thickening in 09/2017.  She isn't sure if she ever took the recommended PPI.  She currently denies any indigestion or heartburn.  She avoids those activities.  No issues with spinning with dancing.    Anxiety and insomnia:  We had tried starting her on lexapro  when she first started coming here. She didn't like how it made her feel, and she preferred to go back to what worked well enough for her--a regimen of clonazepam  and trazodone  each night (clonazepam  around 10:30pm, trazodone  50mg  at 11:30, then goes to bed, falls asleep easily, stays asleep until 7-7:30).  She didn't tolerate higher doses of trazodone  (to try and reduce the clonazepam  use).  She has been getting counseling for anxiety, seeing Grandville Lax weekly since mid-March. She continues on clonazepam  0.5 mg, 1/2-1 pill BID prn. She has been taking 1 tablet in the evening.  PDMP reviewed, fills #30 each month, last filled 02/13/24. She has no additional  refills remaining.   Osteoporosis--.  H/o fosamax  treatment in the distant past, possibly for 7-10 years.  Started again 06/2020. Last DEXA was through her GYN's office 01/21/24 --T-2.6 at R fem neck, -2.5 L fem neck, -2.2 at spine Prior DEXA was 11/2021, T-2.6 at left hip (total, T-2.2 at spine, T-2.3 at L fem neck. Vitamin D  level was normal at 52. She now sees endo here, saw Dr. Vertell Gory (endo) last in 01/2024. She encouraged her to take Ca 750 mg daily, Mg 375 mg daily and Vit D 400 U BID. She was advised to continue the alendronate . She did NOT have the DEXA results available to her at her visit.  She is not getting any weight-bearing exercise. ***   She has h/o abnormal pap (see below), and h/o desquamative inflammatory vaginitis. She is under the care of Dr. Finis Hugger. She was last seen 04/2023 for annual exam.  Denies pain, redness, rash. She is due for pap this year.    Allopurinol  was prescribed in 12/2023 by Dr. Felipe Horton for elevated uric acid. She stopped taking it, reporting it caused diarrhea.  Lab Results  Component Value Date   LABURIC 7.5 (H) 11/06/2023     Immunization History  Administered Date(s) Administered   PFIZER Comirnaty(Gray Top)Covid-19 Tri-Sucrose Vaccine 01/26/2021   PFIZER(Purple Top)SARS-COV-2 Vaccination 12/16/2019, 01/06/2020, 07/23/2020   Pfizer Covid-19 Vaccine Bivalent Booster 87yrs & up 07/08/2021   Pfizer(Comirnaty)Fall Seasonal Vaccine 12 years and older 10/20/2022, 07/02/2023   Zoster, Live 05/30/2011   H/o shingles 3x, last time was in 2016.  She recalls getting  shingles after a shingles vaccination.  She hasn't had Shingrix, and is very hesitant. She refuses flu shots Last Pap smear: 12/2021 ASCUS HPV + --> COLPO CIN 1 ECC PAP 12/2022 NIL HPV POSITIVE (16/18/45 neg)  Last mammogram: 01/21/24 at GYN, normal Last colonoscopy: once, >20 years ago, and refuses any further Last DEXA: 01/21/24 at GYN--T-2.6 at R fem neck, -2.5 L fem neck, -2.2 at  spine Dentist: Ophtho: Exercise:  Line dancing  Last lipids in 02/2023: Tchol 240, TG 61, HDL 96, LDL 132, ratio 2.21 July 2023: Mg 2.4 Vitamin D -OH 43, normal again at 44.34 in 10/2023.    Patient Care Team: Roosvelt Colla, MD as PCP - General (Family Medicine) Endo: Dr. Vertell Gory Sports med: Dr. Cleora Daft and [facility] GYN: Dr. Leighton Punches at [facility] Therapist: Grandville Lax Dentist: Ophtho:  Depression Screening: Flowsheet Row Office Visit from 07/02/2023 in [facility]  PHQ-2 Total Score 0       Flowsheet Row Office Visit from 07/02/2023 in [facility]  PHQ-9 Total Score 0       Falls screen:     05/10/2023   11:31 AM  Fall Risk   Falls in the past year? 0  Number falls in past yr: 0  Injury with Fall? 0  Risk for fall due to : No Fall Risks  Follow up Falls evaluation completed     Functional Status Survey:        End of Life Discussion:  Patient {ACTIONS; HAS/DOES NOT HAVE:19233} a living will and medical power of attorney    PMH, PSH, SH and FH were reviewed and updated   ROS:  Patient denies anorexia, fever, weight changes, headaches, vision changes, decreased hearing, URI symptoms, breast concerns, chest pain, palpitations, dizziness, syncope, dyspnea on exertion, cough, swelling, nausea, vomiting, diarrhea, constipation, abdominal pain, melena, hematochezia, indigestion/heartburn, hematuria, incontinence, dysuria, vaginal bleeding, discharge, odor or itch, genital lesions, joint pains, numbness, tingling, weakness, tremor, suspicious skin lesions, depression, anxiety, abnormal bleeding/bruising, or enlarged lymph nodes.     PHYSICAL EXAM:  There were no vitals taken for this visit.  Wt Readings from Last 3 Encounters:  01/25/24 100 lb (45.4 kg)  12/17/23 103 lb 12.8 oz (47.1 kg)  12/12/23 103 lb (46.7 kg)   Pleasant female, in no distress. HEENT: conjunctiva and sclera are clear, EOMI. Fundi  benign. TM's and EAC's normal. OP clear. No nasal drainage or sinus tenderness Neck: no lymphadenopathy or mass, no thyromegaly or carotid bruit Heart: regular rate and rhythm Lungs: clear bilaterally Back: no spinal or CVA tenderness, no muscle spasm Abdomen: soft, nontender, no mass or organomegaly Extremities: no edema, normal pulses Skin: normal turgor, no rash or lesions. Psych: anxious.  Full range of affect.  Normal speech, hygiene and grooming Neuro: alert and oriented, cranial nerves grossly intact. Normal gait. Breast and pelvic exam deferred to GYN.    ASSESSMENT/PLAN:  [facility]ap and [facility]EX[facility]--which were scanned through ENDO office.  Never abstracted. Please abstract (or send to HIM to do??)   She never made appt with GI (I think you gave her number last time) for IBS, diarrhea.  Prevnar-20 recommended today.  Tdap from pharmacy, RSV in the fall Shingrix from pharmacy Eligible for COVID booster (from pharmacy)--also recommend getting updated one in the Fall.   Still taking trazodone ?? Should have run out (6 mos written Sept)   No breast/pelvic exam, sees GYN.  Cologuard vs colonoscopy vs nothing--and  f/u as we prev discussed with GI for her diarrhea.   Not sure if endo ever got back to patient after getting DEXA results from GYN. Looks like next appt is scheduled for 01/2025. GYN referred her to see Dr. Alease Hunter for management of osteoporosis (???) No appt scheduled (doesn't need to see him, just to f/u with endo!)    Low purine diet and recheck uric acid?  ?Mg contrib to diarrhea?  Discussed monthly self breast exams and yearly mammograms; at least 30 minutes of aerobic activity at least 5 days/week and weight-bearing exercise 2x/week; proper sunscreen use reviewed; healthy diet, including goals of calcium and vitamin D  intake and alcohol recommendations (less than or equal to 1 drink/day) reviewed; regular seatbelt use; changing  batteries in smoke detectors, use of carbon monoxide detectors.  Immunization recommendations discussed--she refuses flu shots. Recommended COVID boosters q6 months.   Recommended TdaP from the pharmacy. Shingrix recommended, to get from pharmacy (pt declines; reminded that new vaccine is not live virus, can't get shingles from the vaccine). RSV vaccine is recommended in the Fall, to get from pharmacy. Colon cancer screening discussed. She refuses colonoscopy.  MOST form completed. Encouraged her to get us  copies of living will and healthcare power of attorney for her chart.   Medicare Attestation I have personally reviewed: The patient's medical and social history Their use of alcohol, tobacco or illicit drugs Their current medications and supplements The patient's functional ability including ADLs,fall risks, home safety risks, cognitive, and hearing and visual impairment Diet and physical activities Evidence for depression or mood disorders  The patient's weight, height, BMI have been recorded in the chart.  I have made referrals, counseling, and provided education to the patient based on review of the above and I have provided the patient with a written personalized care plan for preventive services.     Paulett Boros, MD

## 2024-02-25 NOTE — Patient Instructions (Incomplete)
  Ms. Paige Suarez , Thank you for taking time to come for your Medicare Wellness Visit. I appreciate your ongoing commitment to your health goals. Please review the following plan we discussed and let me know if I can assist you in the future.   This is a list of the screening recommended for you and due dates:  Health Maintenance  Topic Date Due   Hepatitis C Screening  Never done   DTaP/Tdap/Td vaccine (1 - Tdap) Never done   Zoster (Shingles) Vaccine (1 of 2) 03/03/1970   Colon Cancer Screening  Never done   Pneumonia Vaccine (1 of 1 - PCV) Never done   COVID-19 Vaccine (8 - Pfizer risk 2024-25 season) 12/30/2023   Medicare Annual Wellness Visit  02/16/2024   Flu Shot  05/16/2024   Mammogram  01/01/2025   DEXA scan (bone density measurement)  Completed   HPV Vaccine  Aged Out   Meningitis B Vaccine  Aged Out   Tetanus boosters (TdaP) are recommended every 10 years. Please get this from the pharmacy. I highly encourage you to get one of the pneumonia vaccines (either Prevnar-20 or Capvaxive). You can get the Prevnar from our office or the pharmacy, the Babara Lerner is only at certain pharmacies (this is the newest vaccine). You only need ONE of these in your lifetime.  Yearly flu shots are recommended. RSV vaccine is recommended in the Fall. Please get this from the pharmacy.  Continue COVID boosters (can get every 6 months--can get one now from the pharmacy, and another in the Fall when it is updated again).  I recommend getting the new shingles vaccine (Shingrix). This is a series of 2 injections, spaced 2 months apart.  This should be separated from other vaccines by at least 2 weeks. It is NOT a live vaccine, and cannot cause shingles.  Please bring us  copies of your Living Will and Healthcare Power of Attorney so that it can be scanned into your medical chart.  Please try and get weight-bearing exercise at least 2x/week (for your upper and lower body). Consider doing a Silver  Medical illustrator (these are at [employer], including the [employer], [employer], or [address] off of [place]).

## 2024-02-26 ENCOUNTER — Encounter: Payer: Self-pay | Admitting: Family Medicine

## 2024-02-26 ENCOUNTER — Encounter: Payer: Self-pay | Admitting: *Deleted

## 2024-02-26 ENCOUNTER — Ambulatory Visit (INDEPENDENT_AMBULATORY_CARE_PROVIDER_SITE_OTHER): Payer: Medicare Other | Admitting: Family Medicine

## 2024-02-26 VITALS — BP 128/70 | HR 64 | Ht 62.0 in | Wt 100.4 lb

## 2024-02-26 DIAGNOSIS — G4700 Insomnia, unspecified: Secondary | ICD-10-CM

## 2024-02-26 DIAGNOSIS — E790 Hyperuricemia without signs of inflammatory arthritis and tophaceous disease: Secondary | ICD-10-CM

## 2024-02-26 DIAGNOSIS — Z0000 Encounter for general adult medical examination without abnormal findings: Secondary | ICD-10-CM

## 2024-02-26 DIAGNOSIS — F411 Generalized anxiety disorder: Secondary | ICD-10-CM

## 2024-02-26 DIAGNOSIS — K580 Irritable bowel syndrome with diarrhea: Secondary | ICD-10-CM | POA: Diagnosis not present

## 2024-02-26 DIAGNOSIS — M810 Age-related osteoporosis without current pathological fracture: Secondary | ICD-10-CM

## 2024-02-26 MED ORDER — CLONAZEPAM 0.5 MG PO TABS: ORAL_TABLET | ORAL | 5 refills | Status: DC

## 2024-02-26 MED ORDER — TRAZODONE HCL 50 MG PO TABS: 50.0000 mg | ORAL_TABLET | Freq: Every day | ORAL | 3 refills | Status: AC

## 2024-02-27 ENCOUNTER — Other Ambulatory Visit: Payer: Self-pay | Admitting: "Endocrinology

## 2024-02-27 NOTE — Progress Notes (Signed)
 Patient advised.

## 2024-02-27 NOTE — Progress Notes (Signed)
   Scan reviewed, continue current recommendations.

## 2024-02-28 ENCOUNTER — Ambulatory Visit: Payer: Medicare Other | Admitting: Family Medicine

## 2024-02-29 ENCOUNTER — Encounter: Payer: Self-pay | Admitting: Family Medicine

## 2024-03-06 NOTE — Progress Notes (Unsigned)
 [facility] [address] [place] [address] Phone: [phone] Subjective:   IBryan Caprio, am serving as a scribe for Dr. Ronnell Coins.   I'm seeing this patient by the request  of:  Roosvelt Colla, MD  CC: Neck pain  [id number]  01/07/2024 Elevated uric acid, did not tolerate the tart cherry very well so we will try the treatment of the allopurinol .  Warned the potential side effects.  Hopeful patient will be able to tolerate it.  Follow-up again in 6 to 8 weeks otherwise.     Known arthritic changes.  Has made some improvement with formal physical therapy.  Will get other treatment with home exercises.  Worsening symptoms do need to consider advanced imaging.  Hopeful that this will make a difference.     Update 03/11/2024 Payten Hobin is a 73 y.o. female coming in with complaint of DDD cervical. Patient states neck pain still present. Pain has gotten better, but still a 6 or 7. Gets worse as th day progresses. Still can't use pillows. Does exercises from PT. MRI possible?      Past Medical History:  Diagnosis Date   Anxiety    Arthritis    Cataract [id number]   No surgery yet   Osteopenia    Osteoporosis    Past Surgical History:  Procedure Laterality Date   ESOPHAGOGASTRODUODENOSCOPY  09/27/2017   small hiatal hernia, reflux, esophagitis, erosive gastropathy   EYE SURGERY  [id number]   Lasik   ovarectomy     benign   WRIST SURGERY Right    ligament tear, repaired   Social History   Socioeconomic History   Marital status: Married    Spouse name: Not on file   Number of children: Not on file   Years of education: Not on file   Highest education level: Associate degree: academic program  Occupational History   Not on file  Tobacco Use   Smoking status: Former    Current packs/day: 0.00    Types: Cigarettes    Quit date: 1999    Years since quitting: 26.4   Smokeless tobacco: Never  Vaping Use   Vaping status: Never  Used  Substance and Sexual Activity   Alcohol use: Not Currently    Comment: One every 3 months or so   Drug use: Not Currently    Types: Marijuana   Sexual activity: Yes    Birth control/protection: None  Other Topics Concern   Not on file  Social History Narrative   Married. LIves with husband, no pets.   Moved from [place], [place]   2 daughters (1 in [place], 1 in [place])      Enjoys dancing (line dancing)      Updated 02/2024   Social Drivers of Health   Financial Resource Strain: Low Risk  (02/26/2024)   Overall Financial Resource Strain (CARDIA)    Difficulty of Paying Living Expenses: Not hard at all  Food Insecurity: No Food Insecurity (02/26/2024)   Hunger Vital Sign    Worried About Running Out of Food in the Last Year: Never true    Ran Out of Food in the Last Year: Never true  Transportation Needs: No Transportation Needs (02/26/2024)   PRAPARE - Administrator, Civil Service (Medical): No    Lack of Transportation (Non-Medical): No  Physical Activity: Sufficiently Active (02/26/2024)   Exercise Vital Sign    Days of Exercise per Week: 2 days  Minutes of Exercise per Session: 150+ min  Recent Concern: Physical Activity - Insufficiently Active (12/11/2023)   Exercise Vital Sign    Days of Exercise per Week: 1 day    Minutes of Exercise per Session: 120 min  Stress: Stress Concern Present (02/26/2024)   [employer] of Occupational Health - Occupational Stress Questionnaire    Feeling of Stress : Very much  Social Connections: Moderately Integrated (02/26/2024)   Social Connection and Isolation Panel [NHANES]    Frequency of Communication with Friends and Family: More than three times a week    Frequency of Social Gatherings with Friends and Family: Once a week    Attends Religious Services: Never    Database administrator or Organizations: Yes    Attends Engineer, structural: More than 4 times per year    Marital Status: Married   Allergies   Allergen Reactions   Sulfa Antibiotics    Oxycodone Nausea And Vomiting and Rash    Pt states any strong pain medication affects her   Family History  Problem Relation Age of Onset   Cancer Father        brain   Heart disease Father    Stroke Father    Heart attack Sister 71   Obesity Daughter     Current Outpatient Medications (Endocrine & Metabolic):    alendronate  (FOSAMAX ) 70 MG tablet, Take 1 tablet (70 mg total) by mouth once a week. Take with a full glass of water on an empty stomach.    Current Outpatient Medications (Analgesics):    ibuprofen (ADVIL) 200 MG tablet, Take 200 mg by mouth every 6 (six) hours as needed.   Current Outpatient Medications (Other):    clonazePAM  (KLONOPIN ) 0.5 MG tablet, TAKE HALF TO ONE TABLET BY MOUTH 2 TIMES DAILY AS NEEDED FOR ANXIETY. USE caution WITH driving IF taking during THE DAY   COLOSTRUM PO, Take 1 capsule by mouth daily.   estradiol (ESTRACE) 0.1 MG/GM vaginal cream, Place 1 Applicatorful vaginally 4 (four) times a week.   Multiple Vitamin (MULTIVITAMIN ADULT PO), Take 1 tablet by mouth daily.   NONFORMULARY OR COMPOUNDED ITEM,    NONFORMULARY OR COMPOUNDED ITEM,    NONFORMULARY OR COMPOUNDED ITEM, Take 1 capsule by mouth 2 (two) times daily.   NONFORMULARY OR COMPOUNDED ITEM, Take 2 capsules by mouth 2 (two) times daily.   Probiotic Product (PRO-BIOTIC BLEND PO), Take 1 capsule by mouth 2 (two) times daily.   traZODone  (DESYREL ) 50 MG tablet, Take 1 tablet (50 mg total) by mouth at bedtime.   VITAMIN A PO, Take 1 capsule by mouth daily.   VITAMIN D , CHOLECALCIFEROL, PO, Take 800 Int'l Units/L by mouth.   Reviewed prior external information including notes and imaging from  primary care provider As well as notes that were available from care everywhere and other healthcare systems.  Past medical history, social, surgical and family history all reviewed in electronic medical record.  No pertanent information unless stated  regarding to the chief complaint.   Review of Systems:  No headache, visual changes, nausea, vomiting, diarrhea, constipation, dizziness, abdominal pain, skin rash, fevers, chills, night sweats, weight loss, swollen lymph nodes, body aches, joint swelling, chest pain, shortness of breath, mood changes. POSITIVE muscle aches  Objective  Blood pressure 114/72, pulse 62, height 5\' 2"  (1.575 m), weight 101 lb (45.8 kg), SpO2 97%.   General: No apparent distress alert and oriented x3 mood and affect normal, dressed appropriately.  HEENT: Pupils equal, extraocular movements intact  Respiratory: Patient's speak in full sentences and does not appear short of breath  Cardiovascular: No lower extremity edema, non tender, no erythema  Neck exam shows significant loss of lordosis.  Some weakness noted in the C7 distribution.  Tender to palpation with sidebending bilaterally.  Patient does have some limited range of motion especially with extension of the neck.    Impression and Recommendations:    The above documentation has been reviewed and is accurate and complete Kentavius Dettore M Blanch Stang, DO

## 2024-03-11 ENCOUNTER — Ambulatory Visit
Admission: RE | Admit: 2024-03-11 | Discharge: 2024-03-11 | Disposition: A | Discharge status: Home / Self Care | Source: Ambulatory Visit | Attending: Family Medicine | Admitting: Family Medicine

## 2024-03-11 ENCOUNTER — Ambulatory Visit (INDEPENDENT_AMBULATORY_CARE_PROVIDER_SITE_OTHER): Admitting: Family Medicine

## 2024-03-11 ENCOUNTER — Encounter: Payer: Self-pay | Admitting: Family Medicine

## 2024-03-11 ENCOUNTER — Other Ambulatory Visit: Payer: Self-pay | Admitting: Family Medicine

## 2024-03-11 VITALS — BP 114/72 | HR 62 | Ht 62.0 in | Wt 101.0 lb

## 2024-03-11 DIAGNOSIS — M5030 Other cervical disc degeneration, unspecified cervical region: Secondary | ICD-10-CM

## 2024-03-11 DIAGNOSIS — S00259A Superficial foreign body of unspecified eyelid and periocular area, initial encounter: Secondary | ICD-10-CM

## 2024-03-11 NOTE — Assessment & Plan Note (Signed)
 Patient has worked very hard with me neck pain at the moment.  Patient has failed now greater than 4 months of conservative therapy including therapy, medications, icing regimen, home exercises.  Still affecting daily activities and waking her up at night.  Point I would like patient to get an MRI to further evaluate to see if patient is a candidate for epidural.  Patient's would then follow-up with me after any intervention necessary

## 2024-03-11 NOTE — Patient Instructions (Signed)
 Take ibuprofen as needed Good to see you! [facility] [phone] Call Today  When we receive your results we will contact you.

## 2024-03-17 ENCOUNTER — Encounter: Payer: Self-pay | Admitting: Family Medicine

## 2024-03-23 ENCOUNTER — Ambulatory Visit
Admission: RE | Admit: 2024-03-23 | Discharge: 2024-03-23 | Disposition: A | Discharge status: Home / Self Care | Source: Ambulatory Visit | Attending: Family Medicine | Admitting: Family Medicine

## 2024-03-23 DIAGNOSIS — M5030 Other cervical disc degeneration, unspecified cervical region: Secondary | ICD-10-CM

## 2024-03-28 ENCOUNTER — Ambulatory Visit: Payer: Self-pay | Admitting: Family Medicine

## 2024-03-28 ENCOUNTER — Other Ambulatory Visit: Payer: Self-pay

## 2024-03-28 DIAGNOSIS — M5412 Radiculopathy, cervical region: Secondary | ICD-10-CM

## 2024-03-31 NOTE — Discharge Instructions (Signed)

## 2024-04-01 ENCOUNTER — Ambulatory Visit
Admission: RE | Admit: 2024-04-01 | Discharge: 2024-04-01 | Disposition: A | Discharge status: Home / Self Care | Source: Ambulatory Visit | Attending: Family Medicine | Admitting: Family Medicine

## 2024-04-01 DIAGNOSIS — M5412 Radiculopathy, cervical region: Secondary | ICD-10-CM

## 2024-04-01 MED ORDER — TRIAMCINOLONE ACETONIDE 40 MG/ML IJ SUSP (RADIOLOGY)
60.0000 mg | Freq: Once | INTRAMUSCULAR | Status: AC
  Administered 2024-04-01: 60 mg via EPIDURAL

## 2024-04-01 MED ORDER — IOPAMIDOL (ISOVUE-M 300) INJECTION 61%
1.0000 mL | Freq: Once | INTRAMUSCULAR | Status: AC | PRN
  Administered 2024-04-01: 1 mL via EPIDURAL

## 2024-05-12 NOTE — Progress Notes (Unsigned)
 [facility] [address] [place] [address] Phone: [phone] Subjective:   Paige Suarez Paige Suarez, am serving as a scribe for Dr. Arthea Claudene.  I'm seeing this patient by the request  of:  Randol Dawes, MD  CC: Neck pain follow-up  YEP:Dlagzrupcz  03/11/2024 Patient has worked very hard with me neck pain at the moment.  Patient has failed now greater than 4 months of conservative therapy including therapy, medications, icing regimen, home exercises.  Still affecting daily activities and waking her up at night.  Point I would like patient to get an MRI to further evaluate to see if patient is a candidate for epidural.  Patient's would then follow-up with me after any intervention necessary      Update 05/13/2024 Paige Suarez is a 73 y.o. female coming in with complaint of cervical spine pain. Epidural on 04/01/2024. Patient states that she had some relief from epidural. More pain on both sides. C/o frequent headaches as the day progresses. Pain in L frontal bone. Trying to work on posture. Wears a night guard at night and she feels like she is tensing her facial muscles during the day.   Will turmeric or DMSO help with her arthritis?      Past Medical History:  Diagnosis Date   Anxiety    Arthritis    Cataract 072024   No surgery yet   Osteopenia    Osteoporosis    Past Surgical History:  Procedure Laterality Date   ESOPHAGOGASTRODUODENOSCOPY  09/27/2017   small hiatal hernia, reflux, esophagitis, erosive gastropathy   EYE SURGERY  [id number]   Lasik   ovarectomy     benign   WRIST SURGERY Right    ligament tear, repaired   Social History   Socioeconomic History   Marital status: Married    Spouse name: Not on file   Number of children: Not on file   Years of education: Not on file   Highest education level: Associate degree: academic program  Occupational History   Not on file  Tobacco Use   Smoking status: Former    Current  packs/day: 0.00    Types: Cigarettes    Quit date: 1999    Years since quitting: 26.5   Smokeless tobacco: Never  Vaping Use   Vaping status: Never Used  Substance and Sexual Activity   Alcohol use: Not Currently    Comment: One every 3 months or so   Drug use: Not Currently    Types: Marijuana   Sexual activity: Yes    Birth control/protection: None  Other Topics Concern   Not on file  Social History Narrative   Married. LIves with husband, no pets.   Moved from [place], [place]   2 daughters (1 in [place], 1 in [place])      Enjoys dancing (line dancing)      Updated 02/2024   Social Drivers of Health   Financial Resource Strain: Low Risk  (02/26/2024)   Overall Financial Resource Strain (CARDIA)    Difficulty of Paying Living Expenses: Not hard at all  Food Insecurity: No Food Insecurity (02/26/2024)   Hunger Vital Sign    Worried About Running Out of Food in the Last Year: Never true    Ran Out of Food in the Last Year: Never true  Transportation Needs: No Transportation Needs (02/26/2024)   PRAPARE - Administrator, Civil Service (Medical): No    Lack of Transportation (Non-Medical): No  Physical Activity: Sufficiently Active (02/26/2024)   Exercise Vital Sign    Days of Exercise per Week: 2 days    Minutes of Exercise per Session: 150+ min  Recent Concern: Physical Activity - Insufficiently Active (12/11/2023)   Exercise Vital Sign    Days of Exercise per Week: 1 day    Minutes of Exercise per Session: 120 min  Stress: Stress Concern Present (02/26/2024)   [employer] of Occupational Health - Occupational Stress Questionnaire    Feeling of Stress : Very much  Social Connections: Moderately Integrated (02/26/2024)   Social Connection and Isolation Panel    Frequency of Communication with Friends and Family: More than three times a week    Frequency of Social Gatherings with Friends and Family: Once a week    Attends Religious Services: Never    Automotive engineer or Organizations: Yes    Attends Engineer, structural: More than 4 times per year    Marital Status: Married   Allergies  Allergen Reactions   Sulfa Antibiotics    Oxycodone Nausea And Vomiting and Rash    Pt states any strong pain medication affects her   Family History  Problem Relation Age of Onset   Cancer Father        brain   Heart disease Father    Stroke Father    Heart attack Sister 31   Obesity Daughter     Current Outpatient Medications (Endocrine & Metabolic):    alendronate  (FOSAMAX ) 70 MG tablet, Take 1 tablet (70 mg total) by mouth once a week. Take with a full glass of water on an empty stomach.    Current Outpatient Medications (Analgesics):    ibuprofen (ADVIL) 200 MG tablet, Take 200 mg by mouth every 6 (six) hours as needed.   Current Outpatient Medications (Other):    clonazePAM  (KLONOPIN ) 0.5 MG tablet, TAKE HALF TO ONE TABLET BY MOUTH 2 TIMES DAILY AS NEEDED FOR ANXIETY. USE caution WITH driving IF taking during THE DAY   COLOSTRUM PO, Take 1 capsule by mouth daily.   estradiol (ESTRACE) 0.1 MG/GM vaginal cream, Place 1 Applicatorful vaginally 4 (four) times a week.   Multiple Vitamin (MULTIVITAMIN ADULT PO), Take 1 tablet by mouth daily.   NONFORMULARY OR COMPOUNDED ITEM,    NONFORMULARY OR COMPOUNDED ITEM,    NONFORMULARY OR COMPOUNDED ITEM, Take 1 capsule by mouth 2 (two) times daily.   NONFORMULARY OR COMPOUNDED ITEM, Take 2 capsules by mouth 2 (two) times daily.   Probiotic Product (PRO-BIOTIC BLEND PO), Take 1 capsule by mouth 2 (two) times daily.   traZODone  (DESYREL ) 50 MG tablet, Take 1 tablet (50 mg total) by mouth at bedtime.   VITAMIN A PO, Take 1 capsule by mouth daily.   VITAMIN D , CHOLECALCIFEROL, PO, Take 800 Int'l Units/L by mouth.   Reviewed prior external information including notes and imaging from  primary care provider As well as notes that were available from care everywhere and other healthcare  systems.  Past medical history, social, surgical and family history all reviewed in electronic medical record.  No pertanent information unless stated regarding to the chief complaint.   Review of Systems:  No  visual changes, nausea, vomiting, diarrhea, constipation, dizziness, abdominal pain, skin rash, fevers, chills, night sweats, weight loss, swollen lymph nodes, body aches, joint swelling, chest pain, shortness of breath, mood changes. POSITIVE muscle aches, headache  Objective  Blood pressure (!) 142/88, pulse (!) 52, height 5' 2 (1.575  m), weight 98 lb (44.5 kg), SpO2 98%.   General: No apparent distress alert and oriented x3 mood and affect normal, dressed appropriately.  HEENT: Pupils equal, extraocular movements intact  Respiratory: Patient's speak in full sentences and does not appear short of breath  Cardiovascular: No lower extremity edema, non tender, no erythema  Neck exam shows tightness noted.  Some tenderness to palpation in the paraspinal musculature.  Patient has very limited sidebending.  Only 5 degrees of extension of the neck.  Good grip strength noted today.  Patient appears somewhat comfortable but still has more of a mild spasm of the trapezius on the left side.    Impression and Recommendations:     The above documentation has been reviewed and is accurate and complete Paige Suarez M Zalyn Amend, DO

## 2024-05-13 ENCOUNTER — Ambulatory Visit (INDEPENDENT_AMBULATORY_CARE_PROVIDER_SITE_OTHER): Admitting: Family Medicine

## 2024-05-13 VITALS — BP 142/88 | HR 52 | Ht 62.0 in | Wt 98.0 lb

## 2024-05-13 DIAGNOSIS — M5030 Other cervical disc degeneration, unspecified cervical region: Secondary | ICD-10-CM | POA: Diagnosis not present

## 2024-05-13 DIAGNOSIS — M5412 Radiculopathy, cervical region: Secondary | ICD-10-CM | POA: Diagnosis not present

## 2024-05-13 NOTE — Assessment & Plan Note (Addendum)
 Discussed with patient at great length about the degenerative disc disease in the cervical spine.  This corresponds with a lot of patient's symptoms especially with her being left-sided.  Has facet edema on MRI even from C2 and significant foraminal narrowing at multiple levels.  Discussed with patient about icing regimen and home exercises, discussed with patient about posture and ergonomics.  Will discuss different treatment options including surgical intervention versus other conservative therapy.  Patient had a list of questions of different topical medicines and over-the-counter medications as well and we discussed.  At this point we decided we will try another epidural in the cervical spine.  It did seem to help more than what we anticipated.  Make another differenceHopefully this.  Patient wants to avoid any type of surgical intervention.  Discussed if any weakness in the upper extremity we should focus on that as well.  Follow-up with me again 3 months

## 2024-05-13 NOTE — Patient Instructions (Signed)
 Epidural C7-T1 Do bandwork more than anything else Keep enjoying dancing See me in 3 months

## 2024-05-28 NOTE — Discharge Instructions (Signed)

## 2024-05-29 ENCOUNTER — Ambulatory Visit
Admission: RE | Admit: 2024-05-29 | Discharge: 2024-05-29 | Disposition: A | Discharge status: Home / Self Care | Source: Ambulatory Visit | Attending: Family Medicine | Admitting: Family Medicine

## 2024-05-29 DIAGNOSIS — M5412 Radiculopathy, cervical region: Secondary | ICD-10-CM

## 2024-05-29 MED ORDER — TRIAMCINOLONE ACETONIDE 40 MG/ML IJ SUSP (RADIOLOGY)
60.0000 mg | Freq: Once | INTRAMUSCULAR | Status: AC
  Administered 2024-05-29: 60 mg via EPIDURAL

## 2024-05-29 MED ORDER — IOPAMIDOL (ISOVUE-M 300) INJECTION 61%
1.0000 mL | Freq: Once | INTRAMUSCULAR | Status: AC | PRN
  Administered 2024-05-29: 1 mL via EPIDURAL

## 2024-08-06 ENCOUNTER — Telehealth: Payer: Self-pay | Admitting: Family Medicine

## 2024-08-06 ENCOUNTER — Other Ambulatory Visit: Payer: Self-pay

## 2024-08-06 DIAGNOSIS — M5030 Other cervical disc degeneration, unspecified cervical region: Secondary | ICD-10-CM

## 2024-08-06 NOTE — Telephone Encounter (Signed)
 Patient called and stated that she went to get up from the floor about 5-6 weeks ago and hit her head on the shelf above her and she is still have headaches from that. She did not know if she should wait to see you next week for that and if she should have an xray before she comes in. She also states that she is wanting to get another injection for her neck and was asking how long it might take to get scheduled for that after her appt next week. Please advise.

## 2024-08-06 NOTE — Telephone Encounter (Signed)
 Please advise on headaches after hitting head. Thank you

## 2024-08-07 NOTE — Telephone Encounter (Signed)
 Patient called to provide the date for the appt.for the injection in her neck. It is Thursday November 6th at 10:00. She would like to know if Dr. Claudene would still like to see her next week. Please advise.

## 2024-08-07 NOTE — Telephone Encounter (Signed)
 Spoke with patient. She will cancel her appt next week. Rescheduled in December.

## 2024-08-10 ENCOUNTER — Encounter: Payer: Self-pay | Admitting: Family Medicine

## 2024-08-11 ENCOUNTER — Other Ambulatory Visit: Payer: Self-pay | Admitting: Family Medicine

## 2024-08-11 DIAGNOSIS — G4700 Insomnia, unspecified: Secondary | ICD-10-CM

## 2024-08-11 DIAGNOSIS — F411 Generalized anxiety disorder: Secondary | ICD-10-CM

## 2024-08-13 ENCOUNTER — Ambulatory Visit: Admitting: Family Medicine

## 2024-08-19 ENCOUNTER — Other Ambulatory Visit: Payer: Self-pay | Admitting: *Deleted

## 2024-08-19 ENCOUNTER — Encounter: Payer: Self-pay | Admitting: Family Medicine

## 2024-08-19 DIAGNOSIS — K580 Irritable bowel syndrome with diarrhea: Secondary | ICD-10-CM

## 2024-08-20 NOTE — Discharge Instructions (Signed)

## 2024-08-21 ENCOUNTER — Inpatient Hospital Stay
Admission: RE | Admit: 2024-08-21 | Discharge: 2024-08-21 | Disposition: A | Source: Ambulatory Visit | Attending: Family Medicine | Admitting: Family Medicine

## 2024-08-21 DIAGNOSIS — M5030 Other cervical disc degeneration, unspecified cervical region: Secondary | ICD-10-CM

## 2024-08-21 MED ORDER — IOPAMIDOL (ISOVUE-M 300) INJECTION 61%
1.0000 mL | Freq: Once | INTRAMUSCULAR | Status: AC | PRN
  Administered 2024-08-21: 1 mL via EPIDURAL

## 2024-08-21 MED ORDER — TRIAMCINOLONE ACETONIDE 40 MG/ML IJ SUSP (RADIOLOGY)
60.0000 mg | Freq: Once | INTRAMUSCULAR | Status: AC
  Administered 2024-08-21: 60 mg via EPIDURAL

## 2024-09-10 ENCOUNTER — Other Ambulatory Visit: Payer: Self-pay | Admitting: Family Medicine

## 2024-09-10 DIAGNOSIS — G4700 Insomnia, unspecified: Secondary | ICD-10-CM

## 2024-09-10 DIAGNOSIS — F411 Generalized anxiety disorder: Secondary | ICD-10-CM

## 2024-09-10 NOTE — Telephone Encounter (Signed)
 Is this okay to refill?

## 2024-10-01 NOTE — Progress Notes (Unsigned)
 [facility] [address] [place] [address] Phone: [phone] Subjective:   Paige Suarez, am serving as a scribe for Dr. Arthea Claudene.  I'm seeing this patient by the request  of:  Randol Dawes, MD  CC: Neck pain  YEP:Dlagzrupcz  05/13/2024 Discussed with patient at great length about the degenerative disc disease in the cervical spine. This corresponds with a lot of patient's symptoms especially with her being left-sided. Has facet edema on MRI even from C2 and significant foraminal narrowing at multiple levels. Discussed with patient about icing regimen and home exercises, discussed with patient about posture and ergonomics. Will discuss different treatment options including surgical intervention versus other conservative therapy. Patient had a list of questions of different topical medicines and over-the-counter medications as well and we discussed. At this point we decided we will try another epidural in the cervical spine. It did seem to help more than what we anticipated. Make another differenceHopefully this. Patient wants to avoid any type of surgical intervention. Discussed if any weakness in the upper extremity we should focus on that as well. Follow-up with me again 3 months   Update 10/02/2024 Paige Suarez is a 73 y.o. female coming in with complaint of cervical spine pain. Epidural 08/21/2024. Patient states that the epidural was not as effective as the last one. Constant 5/10 pain. Having hard time sleeping.   Patient hit her forehead in August and when she gets a headache she will also have pain in this area. When she doesn't have a headache she still notices some discomfort in this area.       Past Medical History:  Diagnosis Date   Anxiety    Arthritis    Cataract 072024   No surgery yet   Osteopenia    Osteoporosis    Past Surgical History:  Procedure Laterality Date   ESOPHAGOGASTRODUODENOSCOPY  09/27/2017   small hiatal  hernia, reflux, esophagitis, erosive gastropathy   EYE SURGERY  [id number]   Lasik   ovarectomy     benign   WRIST SURGERY Right    ligament tear, repaired   Social History   Socioeconomic History   Marital status: Married    Spouse name: Not on file   Number of children: Not on file   Years of education: Not on file   Highest education level: Associate degree: academic program  Occupational History   Not on file  Tobacco Use   Smoking status: Former    Current packs/day: 0.00    Types: Cigarettes    Quit date: 1999    Years since quitting: 26.9   Smokeless tobacco: Never  Vaping Use   Vaping status: Never Used  Substance and Sexual Activity   Alcohol use: Not Currently    Comment: One every 3 months or so   Drug use: Not Currently    Types: Marijuana   Sexual activity: Yes    Birth control/protection: None  Other Topics Concern   Not on file  Social History Narrative   Married. LIves with husband, no pets.   Moved from [place], [place]   2 daughters (1 in [place], 1 in [place])      Enjoys dancing (line dancing)      Updated 02/2024   Social Drivers of Health   Tobacco Use: Medium Risk (03/11/2024)   Patient History    Smoking Tobacco Use: Former    Smokeless Tobacco Use: Never    Passive Exposure: Not on file  Financial Resource Strain: Low Risk (02/26/2024)   Overall Financial Resource Strain (CARDIA)    Difficulty of Paying Living Expenses: Not hard at all  Food Insecurity: No Food Insecurity (02/26/2024)   Hunger Vital Sign    Worried About Running Out of Food in the Last Year: Never true    Ran Out of Food in the Last Year: Never true  Transportation Needs: No Transportation Needs (02/26/2024)   PRAPARE - Administrator, Civil Service (Medical): No    Lack of Transportation (Non-Medical): No  Physical Activity: Sufficiently Active (02/26/2024)   Exercise Vital Sign    Days of Exercise per Week: 2 days    Minutes of Exercise per Session: 150+ min   Recent Concern: Physical Activity - Insufficiently Active (12/11/2023)   Exercise Vital Sign    Days of Exercise per Week: 1 day    Minutes of Exercise per Session: 120 min  Stress: Stress Concern Present (02/26/2024)   [employer] of Occupational Health - Occupational Stress Questionnaire    Feeling of Stress : Very much  Social Connections: Moderately Integrated (02/26/2024)   Social Connection and Isolation Panel    Frequency of Communication with Friends and Family: More than three times a week    Frequency of Social Gatherings with Friends and Family: Once a week    Attends Religious Services: Never    Database Administrator or Organizations: Yes    Attends Engineer, Structural: More than 4 times per year    Marital Status: Married  Depression (PHQ2-9): Low Risk (02/26/2024)   Depression (PHQ2-9)    PHQ-2 Score: 0  Alcohol Screen: Low Risk (12/11/2023)   Alcohol Screen    Last Alcohol Screening Score (AUDIT): 1  Housing: Low Risk (02/26/2024)   Housing Stability Vital Sign    Unable to Pay for Housing in the Last Year: No    Number of Times Moved in the Last Year: 0    Homeless in the Last Year: No  Utilities: Not At Risk (02/26/2024)   [employer] Utilities    Threatened with loss of utilities: No  Health Literacy: Not on file   Allergies[1] Family History  Problem Relation Age of Onset   Cancer Father        brain   Heart disease Father    Stroke Father    Heart attack Sister 66   Obesity Daughter     Current Outpatient Medications (Endocrine & Metabolic):    alendronate  (FOSAMAX ) 70 MG tablet, Take 1 tablet (70 mg total) by mouth once a week. Take with a full glass of water on an empty stomach.  Current Outpatient Medications (Analgesics):    ibuprofen (ADVIL) 200 MG tablet, Take 200 mg by mouth every 6 (six) hours as needed.  Current Outpatient Medications (Other):    clonazePAM  (KLONOPIN ) 0.5 MG tablet, TAKE 1/2 TO 1 TABLET BY MOUTH 2 TIMES DAILY AS NEEDED  FOR ANXIETY. USE caution WITH driving IF taking during THE DAY   COLOSTRUM PO, Take 1 capsule by mouth daily.   DULoxetine  (CYMBALTA ) 20 MG capsule, Take 1 capsule (20 mg total) by mouth daily.   estradiol (ESTRACE) 0.1 MG/GM vaginal cream, Place 1 Applicatorful vaginally 4 (four) times a week.   gabapentin  (NEURONTIN ) 100 MG capsule, Take 2 capsules (200 mg total) by mouth at bedtime.   Multiple Vitamin (MULTIVITAMIN ADULT PO), Take 1 tablet by mouth daily.   Probiotic Product (PRO-BIOTIC BLEND PO), Take 1 capsule by mouth  2 (two) times daily.   traZODone  (DESYREL ) 50 MG tablet, Take 1 tablet (50 mg total) by mouth at bedtime.   VITAMIN A PO, Take 1 capsule by mouth daily.   VITAMIN D , CHOLECALCIFEROL, PO, Take 800 Int'l Units/L by mouth.   NONFORMULARY OR COMPOUNDED ITEM,    NONFORMULARY OR COMPOUNDED ITEM,    NONFORMULARY OR COMPOUNDED ITEM, Take 1 capsule by mouth 2 (two) times daily.   NONFORMULARY OR COMPOUNDED ITEM, Take 2 capsules by mouth 2 (two) times daily.   Reviewed prior external information including notes and imaging from  primary care provider As well as notes that were available from care everywhere and other healthcare systems.  Past medical history, social, surgical and family history all reviewed in electronic medical record.  No pertanent information unless stated regarding to the chief complaint.   Review of Systems:  No headache, visual changes, nausea, vomiting, diarrhea, constipation, dizziness, abdominal pain, skin rash, fevers, chills, night sweats, weight loss, swollen lymph nodes, body aches, joint swelling, chest pain, shortness of breath, mood changes. POSITIVE muscle aches  Objective  Blood pressure 112/82, pulse 63, height 5' 2 (1.575 m), weight 100 lb (45.4 kg), SpO2 99%.   General: No apparent distress alert and oriented x3 mood and affect normal, dressed appropriately.  HEENT: Pupils equal, extraocular movements intact  Respiratory: Patient's speak  in full sentences and does not appear short of breath  Cardiovascular: No lower extremity edema, non tender, no erythema  Significant loss of lordosis of the neck.  The patient still has some worsening pain with extension and lacks the last 10 degrees.  Negative Spurling's no noted today.  Patient's grip strength is 4 out of 5 but symmetric to the contralateral side.    Impression and Recommendations:    The above documentation has been reviewed and is accurate and complete Paige CHRISTELLA Sharps, DO        [1]  Allergies Allergen Reactions   Sulfa Antibiotics    Oxycodone Nausea And Vomiting and Rash    Pt states any strong pain medication affects her

## 2024-10-02 ENCOUNTER — Ambulatory Visit: Admitting: Family Medicine

## 2024-10-02 ENCOUNTER — Ambulatory Visit

## 2024-10-02 VITALS — BP 112/82 | HR 63 | Ht 62.0 in | Wt 100.0 lb

## 2024-10-02 DIAGNOSIS — M5030 Other cervical disc degeneration, unspecified cervical region: Secondary | ICD-10-CM

## 2024-10-02 DIAGNOSIS — J3489 Other specified disorders of nose and nasal sinuses: Secondary | ICD-10-CM | POA: Diagnosis not present

## 2024-10-02 DIAGNOSIS — E790 Hyperuricemia without signs of inflammatory arthritis and tophaceous disease: Secondary | ICD-10-CM

## 2024-10-02 DIAGNOSIS — E611 Iron deficiency: Secondary | ICD-10-CM | POA: Diagnosis not present

## 2024-10-02 DIAGNOSIS — E559 Vitamin D deficiency, unspecified: Secondary | ICD-10-CM | POA: Diagnosis not present

## 2024-10-02 DIAGNOSIS — R5383 Other fatigue: Secondary | ICD-10-CM

## 2024-10-02 LAB — COMPREHENSIVE METABOLIC PANEL WITH GFR
ALT: 15 U/L (ref 3–35)
AST: 24 U/L (ref 5–37)
Albumin: 4.3 g/dL (ref 3.5–5.2)
Alkaline Phosphatase: 47 U/L (ref 39–117)
BUN: 17 mg/dL (ref 6–23)
CO2: 31 meq/L (ref 19–32)
Calcium: 9.9 mg/dL (ref 8.4–10.5)
Chloride: 102 meq/L (ref 96–112)
Creatinine, Ser: 0.67 mg/dL (ref 0.40–1.20)
GFR: 85.4 mL/min (ref >60.00)
Glucose, Bld: 72 mg/dL (ref 70–99)
Potassium: 4.3 meq/L (ref 3.5–5.1)
Sodium: 140 meq/L (ref 135–145)
Total Bilirubin: 0.3 mg/dL (ref 0.2–1.2)
Total Protein: 7.1 g/dL (ref 6.0–8.3)

## 2024-10-02 LAB — CBC WITH DIFFERENTIAL/PLATELET
Basophils Absolute: 0 K/uL (ref 0.0–0.1)
Basophils Relative: 0.6 % (ref 0.0–3.0)
Eosinophils Absolute: 0 K/uL (ref 0.0–0.7)
Eosinophils Relative: 0.6 % (ref 0.0–5.0)
HCT: 40.2 % (ref 36.0–46.0)
Hemoglobin: 13.4 g/dL (ref 12.0–15.0)
Lymphocytes Relative: 26.2 % (ref 12.0–46.0)
Lymphs Abs: 1.9 K/uL (ref 0.7–4.0)
MCHC: 33.4 g/dL (ref 30.0–36.0)
MCV: 91.1 fl (ref 78.0–100.0)
Monocytes Absolute: 0.8 K/uL (ref 0.1–1.0)
Monocytes Relative: 10.4 % (ref 3.0–12.0)
Neutro Abs: 4.5 K/uL (ref 1.4–7.7)
Neutrophils Relative %: 62.2 % (ref 43.0–77.0)
Platelets: 336 K/uL (ref 150.0–400.0)
RBC: 4.42 Mil/uL (ref 3.87–5.11)
RDW: 14.9 % (ref 11.5–15.5)
WBC: 7.3 K/uL (ref 4.0–10.5)

## 2024-10-02 LAB — SEDIMENTATION RATE: Sed Rate: 15 mm/h (ref 0–30)

## 2024-10-02 LAB — IBC PANEL
Iron: 88 ug/dL (ref 42–145)
Saturation Ratios: 21.2 % (ref 20.0–50.0)
TIBC: 414.4 ug/dL (ref 250.0–450.0)
Transferrin: 296 mg/dL (ref 212.0–360.0)

## 2024-10-02 LAB — URIC ACID: Uric Acid, Serum: 5.1 mg/dL (ref 2.4–7.0)

## 2024-10-02 LAB — VITAMIN D 25 HYDROXY (VIT D DEFICIENCY, FRACTURES): VITD: 36.19 ng/mL (ref 30.00–100.00)

## 2024-10-02 LAB — FERRITIN: Ferritin: 28 ng/mL (ref 10.0–291.0)

## 2024-10-02 MED ORDER — GABAPENTIN 100 MG PO CAPS: 200.0000 mg | ORAL_CAPSULE | Freq: Every day | ORAL | 0 refills | Status: AC

## 2024-10-02 MED ORDER — DULOXETINE HCL 20 MG PO CPEP: 20.0000 mg | ORAL_CAPSULE | Freq: Every day | ORAL | 0 refills | Status: DC

## 2024-10-02 NOTE — Assessment & Plan Note (Signed)
 Degenerative disc disease not responding as well to the epidurals is what was happening before.  We discussed with patient about icing regimen and home exercises, start Cymbalta  20 mg.  Patient wants to avoid any type of surgical intervention.  Will get some labs to further evaluate patient's elevated uric acid.  Did feel like she had a head injury multiple months ago but no signs of any concussion at this time.  Will rule out things like a vasculitis with ESR.  Patient is also thinking of following up with her primary care physician.  Follow-up with me again in 2 months to see how patient is doing

## 2024-10-02 NOTE — Assessment & Plan Note (Signed)
 Recheck levels

## 2024-10-02 NOTE — Patient Instructions (Addendum)
 Cymbalta  Gabapentin  Labs Sinus xray See me in 2 months

## 2024-10-04 LAB — PTH, INTACT AND CALCIUM
Calcium: 9.5 mg/dL (ref 8.6–10.4)
PTH: 14 pg/mL — ABNORMAL LOW (ref 16–77)

## 2024-10-06 ENCOUNTER — Ambulatory Visit: Payer: Self-pay | Admitting: Family Medicine

## 2024-10-10 ENCOUNTER — Other Ambulatory Visit: Payer: Self-pay

## 2024-10-10 DIAGNOSIS — M810 Age-related osteoporosis without current pathological fracture: Secondary | ICD-10-CM

## 2024-10-10 MED ORDER — ALENDRONATE SODIUM 70 MG PO TABS: 70.0000 mg | ORAL_TABLET | ORAL | 0 refills | Status: AC

## 2024-10-18 ENCOUNTER — Encounter: Payer: Self-pay | Admitting: Family Medicine

## 2024-10-21 DIAGNOSIS — G8929 Other chronic pain: Secondary | ICD-10-CM

## 2024-10-27 ENCOUNTER — Other Ambulatory Visit: Payer: Self-pay | Admitting: Family Medicine

## 2024-11-12 ENCOUNTER — Other Ambulatory Visit: Payer: Self-pay

## 2024-11-12 MED ORDER — DULOXETINE HCL 20 MG PO CPEP: 20.0000 mg | ORAL_CAPSULE | Freq: Every day | ORAL | 0 refills | Status: AC

## 2024-11-18 ENCOUNTER — Ambulatory Visit
Admission: RE | Admit: 2024-11-18 | Discharge: 2024-11-18 | Disposition: A | Source: Ambulatory Visit | Attending: Family Medicine | Admitting: Family Medicine

## 2024-11-18 DIAGNOSIS — G8929 Other chronic pain: Secondary | ICD-10-CM

## 2024-11-18 MED ORDER — GADOPICLENOL 0.5 MMOL/ML IV SOLN
4.5000 mL | Freq: Once | INTRAVENOUS | Status: AC | PRN
  Administered 2024-11-18: 4.5 mL via INTRAVENOUS

## 2024-11-19 ENCOUNTER — Ambulatory Visit: Payer: Self-pay | Admitting: Family Medicine

## 2024-12-11 ENCOUNTER — Ambulatory Visit: Admitting: Family Medicine

## 2025-01-26 ENCOUNTER — Ambulatory Visit: Admitting: "Endocrinology

## 2025-03-30 ENCOUNTER — Ambulatory Visit: Payer: Self-pay | Admitting: Family Medicine
